# Patient Record
Sex: Female | Born: 1970 | Race: Black or African American | Hispanic: No | Marital: Single | Smoking: Current every day smoker
Health system: Southern US, Community
[De-identification: ages and names within clinical notes are randomized; demographics above are authoritative.]

## PROBLEM LIST (undated history)

## (undated) DIAGNOSIS — K219 Gastro-esophageal reflux disease without esophagitis: Secondary | ICD-10-CM

## (undated) HISTORY — PX: TUBAL LIGATION: SHX77

## (undated) HISTORY — DX: Gastro-esophageal reflux disease without esophagitis: K21.9

## (undated) HISTORY — PX: TONSILLECTOMY: SUR1361

---

## 1999-10-20 ENCOUNTER — Encounter: Payer: Self-pay | Admitting: Obstetrics

## 1999-10-20 ENCOUNTER — Inpatient Hospital Stay (HOSPITAL_COMMUNITY): Admission: AD | Admit: 1999-10-20 | Discharge: 1999-10-20 | Payer: Self-pay | Admitting: Obstetrics

## 2002-01-26 ENCOUNTER — Emergency Department (HOSPITAL_COMMUNITY): Admission: EM | Admit: 2002-01-26 | Discharge: 2002-01-26 | Payer: Self-pay | Admitting: Emergency Medicine

## 2002-01-26 ENCOUNTER — Encounter: Payer: Self-pay | Admitting: Emergency Medicine

## 2002-02-16 ENCOUNTER — Emergency Department (HOSPITAL_COMMUNITY): Admission: EM | Admit: 2002-02-16 | Discharge: 2002-02-16 | Payer: Self-pay | Admitting: Emergency Medicine

## 2003-10-04 ENCOUNTER — Emergency Department (HOSPITAL_COMMUNITY): Admission: EM | Admit: 2003-10-04 | Discharge: 2003-10-04 | Payer: Self-pay | Admitting: Emergency Medicine

## 2005-02-03 ENCOUNTER — Emergency Department (HOSPITAL_COMMUNITY): Admission: EM | Admit: 2005-02-03 | Discharge: 2005-02-03 | Payer: Self-pay | Admitting: Emergency Medicine

## 2005-05-08 ENCOUNTER — Emergency Department (HOSPITAL_COMMUNITY): Admission: EM | Admit: 2005-05-08 | Discharge: 2005-05-08 | Payer: Self-pay | Admitting: Emergency Medicine

## 2007-02-07 ENCOUNTER — Emergency Department (HOSPITAL_COMMUNITY): Admission: EM | Admit: 2007-02-07 | Discharge: 2007-02-07 | Payer: Self-pay | Admitting: Emergency Medicine

## 2007-03-29 ENCOUNTER — Emergency Department (HOSPITAL_COMMUNITY): Admission: EM | Admit: 2007-03-29 | Discharge: 2007-03-29 | Payer: Self-pay | Admitting: Emergency Medicine

## 2007-04-25 ENCOUNTER — Emergency Department (HOSPITAL_COMMUNITY): Admission: EM | Admit: 2007-04-25 | Discharge: 2007-04-25 | Payer: Self-pay | Admitting: General Surgery

## 2007-05-06 HISTORY — PX: ABDOMINAL HYSTERECTOMY: SHX81

## 2008-03-04 ENCOUNTER — Emergency Department (HOSPITAL_BASED_OUTPATIENT_CLINIC_OR_DEPARTMENT_OTHER): Admission: EM | Admit: 2008-03-04 | Discharge: 2008-03-04 | Payer: Self-pay | Admitting: Emergency Medicine

## 2008-10-24 ENCOUNTER — Emergency Department (HOSPITAL_BASED_OUTPATIENT_CLINIC_OR_DEPARTMENT_OTHER): Admission: EM | Admit: 2008-10-24 | Discharge: 2008-10-24 | Payer: Self-pay | Admitting: Emergency Medicine

## 2008-12-01 ENCOUNTER — Emergency Department (HOSPITAL_BASED_OUTPATIENT_CLINIC_OR_DEPARTMENT_OTHER): Admission: EM | Admit: 2008-12-01 | Discharge: 2008-12-01 | Payer: Self-pay | Admitting: Emergency Medicine

## 2010-05-17 ENCOUNTER — Emergency Department (HOSPITAL_BASED_OUTPATIENT_CLINIC_OR_DEPARTMENT_OTHER)
Admission: EM | Admit: 2010-05-17 | Discharge: 2010-05-17 | Payer: Self-pay | Source: Home / Self Care | Admitting: Emergency Medicine

## 2010-08-11 LAB — URINALYSIS, ROUTINE W REFLEX MICROSCOPIC
Bilirubin Urine: NEGATIVE
Glucose, UA: NEGATIVE mg/dL
Ketones, ur: NEGATIVE mg/dL
Nitrite: POSITIVE — AB
Protein, ur: 30 mg/dL — AB
Specific Gravity, Urine: 1.015 (ref 1.005–1.030)
Urobilinogen, UA: 1 mg/dL (ref 0.0–1.0)
pH: 6 (ref 5.0–8.0)

## 2010-08-11 LAB — URINE MICROSCOPIC-ADD ON

## 2010-08-11 LAB — URINE CULTURE: Colony Count: >=100000

## 2011-02-07 LAB — DIFFERENTIAL
Basophils Absolute: 0
Basophils Relative: 1
Eosinophils Relative: 1
Monocytes Absolute: 0.7

## 2011-02-07 LAB — BASIC METABOLIC PANEL
BUN: 2 — ABNORMAL LOW
Calcium: 9.2
Chloride: 107
Creatinine, Ser: 0.57
GFR calc Af Amer: >60
GFR calc non Af Amer: >60

## 2011-02-07 LAB — URINALYSIS, ROUTINE W REFLEX MICROSCOPIC
Bilirubin Urine: NEGATIVE
Nitrite: NEGATIVE
Specific Gravity, Urine: 1.014
Urobilinogen, UA: 1
pH: 7

## 2011-02-07 LAB — CBC
HCT: 35.5 — ABNORMAL LOW
Hemoglobin: 12.2
MCHC: 34.5
MCV: 92.8
RDW: 13

## 2011-02-07 LAB — URINE MICROSCOPIC-ADD ON

## 2011-02-07 LAB — PREGNANCY, URINE: Preg Test, Ur: NEGATIVE

## 2011-02-13 LAB — WET PREP, GENITAL
Trich, Wet Prep: NONE SEEN
Yeast Wet Prep HPF POC: NONE SEEN

## 2011-02-13 LAB — DIFFERENTIAL
Basophils Absolute: 0
Basophils Relative: 0
Monocytes Relative: 6
Neutro Abs: 5.7
Neutrophils Relative %: 66

## 2011-02-13 LAB — URINALYSIS, ROUTINE W REFLEX MICROSCOPIC
Bilirubin Urine: NEGATIVE
Hgb urine dipstick: NEGATIVE
Protein, ur: NEGATIVE
Urobilinogen, UA: 0.2

## 2011-02-13 LAB — CBC
MCHC: 34.3
Platelets: 158
RBC: 4.24
WBC: 8.8

## 2011-02-13 LAB — GC/CHLAMYDIA PROBE AMP, GENITAL: Chlamydia, DNA Probe: NEGATIVE

## 2011-03-09 ENCOUNTER — Emergency Department (HOSPITAL_COMMUNITY)
Admission: EM | Admit: 2011-03-09 | Discharge: 2011-03-10 | Discharge status: Against Medical Advice | Payer: Self-pay | Attending: Emergency Medicine | Admitting: Emergency Medicine

## 2011-03-09 ENCOUNTER — Encounter: Payer: Self-pay | Admitting: *Deleted

## 2011-03-09 DIAGNOSIS — R05 Cough: Secondary | ICD-10-CM | POA: Insufficient documentation

## 2011-03-09 DIAGNOSIS — R059 Cough, unspecified: Secondary | ICD-10-CM | POA: Insufficient documentation

## 2011-03-09 DIAGNOSIS — J40 Bronchitis, not specified as acute or chronic: Secondary | ICD-10-CM | POA: Insufficient documentation

## 2011-03-09 DIAGNOSIS — M549 Dorsalgia, unspecified: Secondary | ICD-10-CM | POA: Insufficient documentation

## 2011-03-09 NOTE — ED Notes (Signed)
Body aches all over cold cough pain inher chest when she coughs for the past 2-3 days with a temp

## 2011-03-10 ENCOUNTER — Emergency Department (HOSPITAL_COMMUNITY): Payer: Self-pay

## 2011-03-10 MED ORDER — HYDROCOD POLST-CHLORPHEN POLST 10-8 MG/5ML PO LQCR
5.0000 mL | Freq: Once | ORAL | Status: AC
  Administered 2011-03-10: 5 mL via ORAL
  Filled 2011-03-10: qty 5

## 2011-03-10 MED ORDER — ALBUTEROL SULFATE HFA 108 (90 BASE) MCG/ACT IN AERS
2.0000 | INHALATION_SPRAY | RESPIRATORY_TRACT | Status: DC
  Administered 2011-03-10: 2 via RESPIRATORY_TRACT
  Filled 2011-03-10: qty 6.7

## 2011-03-10 MED ORDER — HYDROCOD POLST-CHLORPHEN POLST 10-8 MG/5ML PO LQCR: 5.0000 mL | Freq: Two times a day (BID) | ORAL | Status: AC | PRN

## 2011-03-10 MED ORDER — ALBUTEROL SULFATE HFA 108 (90 BASE) MCG/ACT IN AERS: 1.0000 | INHALATION_SPRAY | RESPIRATORY_TRACT | Status: DC | PRN

## 2011-03-10 MED ORDER — ACETAMINOPHEN 325 MG PO TABS
650.0000 mg | ORAL_TABLET | Freq: Once | ORAL | Status: AC
  Administered 2011-03-10: 650 mg via ORAL
  Filled 2011-03-10: qty 2

## 2011-03-10 NOTE — ED Provider Notes (Signed)
History     CSN: [id number] Arrival date & time: 03/09/2011 11:11 PM   First MD Initiated Contact with Patient 03/10/11 0230      Chief Complaint  Patient presents with  . Back Pain     HPI 40 yo female presents to the ER with complaint of 3 days of cough, subjective fevers, and back/chest pain from coughing.  Pt is a smoker, but has not been able to smoke due to illness.  Daughter with similar illness.  No h/o COPD, but has had bronchitis in the past.     History reviewed. No pertinent past medical history.  Past Surgical History  Procedure Date  . Tonsillectomy     History reviewed. No pertinent family history.  History  Substance Use Topics  . Smoking status: Current Everyday Smoker  . Smokeless tobacco: Not on file  . Alcohol Use: Yes    OB History    Grav Para Term Preterm Abortions TAB SAB Ect Mult Living                  Review of Systems  Constitutional: Positive for fever.  Respiratory: Positive for cough, chest tightness, shortness of breath and wheezing.   All other systems reviewed and are negative.    Allergies  Review of patient's allergies indicates no known allergies.  Home Medications   Current Outpatient Rx  Name Route Sig Dispense Refill  . OVER THE COUNTER MEDICATION Oral Take 1 packet by mouth every 6 (six) hours as needed. Alka Seltzer cold and flu as needed for pain and cold symptoms     . ALBUTEROL SULFATE HFA 108 (90 BASE) MCG/ACT IN AERS Inhalation Inhale 1-2 puffs into the lungs every 4 (four) hours as needed for wheezing. 1 Inhaler 0  . HYDROCOD POLST-CHLORPHEN POLST 10-8 MG/5ML PO LQCR Oral Take 5 mLs by mouth every 12 (twelve) hours as needed. 140 mL 0    BP 101/60  Pulse 84  Temp(Src) 100.2 F (37.9 C) (Oral)  Resp 18  SpO2 96%  Physical Exam  Nursing note and vitals reviewed. Constitutional: She is oriented to person, place, and time. She appears well-developed and well-nourished.       Uncomfortable appearing  HENT:   Head: Normocephalic and atraumatic.  Right Ear: External ear normal.  Left Ear: External ear normal.  Nose: Nose normal.  Mouth/Throat: Oropharynx is clear and moist.  Eyes: Conjunctivae and EOM are normal. Pupils are equal, round, and reactive to light.  Neck: Normal range of motion. Neck supple. No JVD present. No tracheal deviation present. No thyromegaly present.  Cardiovascular: Normal rate, regular rhythm, normal heart sounds and intact distal pulses.  Exam reveals no gallop and no friction rub.   No murmur heard. Pulmonary/Chest: Effort normal. No stridor. No respiratory distress. She has wheezes. She has no rales. She exhibits tenderness.       cough  Abdominal: Soft. Bowel sounds are normal. She exhibits no distension and no mass. There is no tenderness. There is no rebound and no guarding.  Musculoskeletal: Normal range of motion. She exhibits no edema and no tenderness.  Lymphadenopathy:    She has no cervical adenopathy.  Neurological: She is oriented to person, place, and time.  Skin: Skin is dry. No rash noted. No erythema. No pallor.  Psychiatric: She has a normal mood and affect. Her behavior is normal. Judgment and thought content normal.    ED Course  Procedures (including critical care time)  Labs Reviewed -  No data to display Dg Chest 2 View  03/10/2011  *RADIOLOGY REPORT*  Clinical Data: Cough, fever, chest pain.  CHEST - 2 VIEW  Comparison: None.  Findings: Mild interstitial prominence without focal consolidation. No pleural effusion or pneumothorax.  Cardiomediastinal contours are within normal limits.  No acute osseous abnormality.  there may be a small calcified granuloma projecting over the left upper lung.  IMPRESSION: Mild interstitial prominence without focal consolidation.  Original Report Authenticated By: Waneta Martins, M.D.     1. Bronchitis       MDM  Pt feeling better after albuterol/tussionex.  Pt cautioned against smoking.  Will prescribe  same for home use.         Olivia Mackie, MD 03/10/11 [phone]

## 2012-09-07 ENCOUNTER — Ambulatory Visit (INDEPENDENT_AMBULATORY_CARE_PROVIDER_SITE_OTHER): Payer: BLUE CROSS/BLUE SHIELD | Admitting: Family Medicine

## 2012-09-07 VITALS — BP 110/72 | HR 92 | Temp 98.4°F | Resp 16 | Ht 63.0 in | Wt 147.0 lb

## 2012-09-07 DIAGNOSIS — N949 Unspecified condition associated with female genital organs and menstrual cycle: Secondary | ICD-10-CM

## 2012-09-07 DIAGNOSIS — IMO0002 Reserved for concepts with insufficient information to code with codable children: Secondary | ICD-10-CM

## 2012-09-07 DIAGNOSIS — Z0000 Encounter for general adult medical examination without abnormal findings: Secondary | ICD-10-CM

## 2012-09-07 DIAGNOSIS — Z803 Family history of malignant neoplasm of breast: Secondary | ICD-10-CM

## 2012-09-07 DIAGNOSIS — R102 Pelvic and perineal pain: Secondary | ICD-10-CM

## 2012-09-07 DIAGNOSIS — K219 Gastro-esophageal reflux disease without esophagitis: Secondary | ICD-10-CM

## 2012-09-07 DIAGNOSIS — N941 Unspecified dyspareunia: Secondary | ICD-10-CM

## 2012-09-07 LAB — POCT CBC
HCT, POC: 42.6 % (ref 37.7–47.9)
Lymph, poc: 3.3 (ref 0.6–3.4)
MCH, POC: 29.9 pg (ref 27–31.2)
MCHC: 31 g/dL — AB (ref 31.8–35.4)
MCV: 96.5 fL (ref 80–97)
POC Granulocyte: 6.8 (ref 2–6.9)
POC LYMPH PERCENT: 30.7 %L (ref 10–50)
RDW, POC: 13.2 %
WBC: 10.8 10*3/uL — AB (ref 4.6–10.2)

## 2012-09-07 LAB — COMPREHENSIVE METABOLIC PANEL
ALT: 17 U/L (ref 0–35)
AST: 18 U/L (ref 0–37)
BUN: 7 mg/dL (ref 6–23)
CO2: 28 mEq/L (ref 19–32)
Calcium: 9.7 mg/dL (ref 8.4–10.5)
Chloride: 103 mEq/L (ref 96–112)
Creat: 0.55 mg/dL (ref 0.50–1.10)
Total Bilirubin: 0.7 mg/dL (ref 0.3–1.2)

## 2012-09-07 LAB — POCT URINALYSIS DIPSTICK
Bilirubin, UA: NEGATIVE
Protein, UA: NEGATIVE
Spec Grav, UA: 1.025
pH, UA: 5

## 2012-09-07 LAB — POCT UA - MICROSCOPIC ONLY
Bacteria, U Microscopic: NEGATIVE
Casts, Ur, LPF, POC: NEGATIVE
Mucus, UA: NEGATIVE
Yeast, UA: NEGATIVE

## 2012-09-07 LAB — POCT WET PREP WITH KOH: KOH Prep POC: NEGATIVE

## 2012-09-07 LAB — TSH: TSH: 2.872 u[IU]/mL (ref 0.350–4.500)

## 2012-09-07 MED ORDER — OMEPRAZOLE 40 MG PO CPDR: 40.0000 mg | DELAYED_RELEASE_CAPSULE | Freq: Every day | ORAL | Status: AC

## 2012-09-07 NOTE — Progress Notes (Signed)
  Subjective:    Patient ID: Tammy Berry, female    DOB: 01-26-1971, 42 y.o.   MRN: [id number]  HPI    Review of Systems  Constitutional: Negative.   HENT: Negative.   Eyes: Negative.   Respiratory: Negative.   Cardiovascular: Positive for chest pain.  Gastrointestinal: Negative.   Genitourinary: Positive for enuresis and vaginal pain.  Musculoskeletal: Positive for arthralgias.  Skin: Negative.   Allergic/Immunologic: Negative.   Neurological: Negative.   Hematological: Negative.   Psychiatric/Behavioral: Negative.        Objective:   Physical Exam        Assessment & Plan:

## 2012-09-07 NOTE — Patient Instructions (Signed)
Get pelvic ultrasound  [facility] and [facility]  Omeprazole one daily for reflux  Return if not improving

## 2012-09-07 NOTE — Progress Notes (Signed)
Physical exam:  History: 42 year old lady who is here for physical examination. She says she is way overdue one. She has a history of pelvic pain and dyspareunia. She would like that is assessed. No other major acute complaints.  Past medical history: Medical illnesses: None Surgical history: T&A Hysterectomy Bilateral tubal ligation Medications: None Allergies: None   Family history: Parents are living. Mother has had breast cancer and hypertension. Father is 93 years old and in good health. Mother 72. Siblings are healthy.  Social history: Patient is not married. One regular sexual partner for the past 5 years. She smokes one third pack cigarettes a day. Drinks 1 glass of wine a month socially. Does not use illicit drugs. She works as a Scientist, research (medical). She does not exercise.  Review of systems: See below for my assistance note  Physical examination: Well-developed well-nourished Afro-American female in no acute distress. TMs normal. Eyes PERRLA. Throat clear. Neck supple without nodes thyromegaly. No carotid bruits. Chest is clear to auscultation. Heart regular without murmurs gallops or arrhythmias. Abdomen soft without mass or tenderness. Has a tattoo on left breast. Breasts full without any masses. No axillary nodes. She has an umbilical ring. Pelvic normal external genitalia. Vaginal mucosa dry, some whitish discharge. Cervix is absent. Bimanual exam feels tenderness of the right ovary. However it does not feel particularly enlarged. Left is normal. Extremities unremarkable. Has joint pains in her hands, but that is probably from using them.:   Assessment: Physical examination, annual Arthralgias Right pelvic pain Dyspareunia Family history of breast cancer  Plan: Check labs including URiprobe, CBC, C. met, TSH, lipid. Schedule mammogram. Schedule pelvic ultrasound.

## 2012-09-08 ENCOUNTER — Other Ambulatory Visit: Payer: Self-pay | Admitting: Radiology

## 2012-09-08 DIAGNOSIS — R102 Pelvic and perineal pain: Secondary | ICD-10-CM

## 2012-09-08 LAB — GC/CHLAMYDIA PROBE AMP, URINE: GC Probe Amp, Urine: NEGATIVE

## 2012-09-09 LAB — HELICOBACTER PYLORI  ANTIBODY, IGM: Helicobacter pylori, IgM: 8.1 U/mL (ref <9.0)

## 2012-09-13 ENCOUNTER — Ambulatory Visit
Admission: RE | Admit: 2012-09-13 | Discharge: 2012-09-13 | Disposition: A | Discharge status: Home / Self Care | Payer: BC Managed Care – PPO | Source: Ambulatory Visit | Attending: Family Medicine | Admitting: Family Medicine

## 2012-09-13 DIAGNOSIS — R102 Pelvic and perineal pain: Secondary | ICD-10-CM

## 2012-09-13 DIAGNOSIS — N941 Unspecified dyspareunia: Secondary | ICD-10-CM

## 2012-11-08 ENCOUNTER — Other Ambulatory Visit: Payer: Self-pay

## 2013-04-07 ENCOUNTER — Ambulatory Visit (INDEPENDENT_AMBULATORY_CARE_PROVIDER_SITE_OTHER): Payer: BLUE CROSS/BLUE SHIELD | Admitting: Emergency Medicine

## 2013-04-07 VITALS — BP 110/68 | HR 84 | Temp 98.2°F | Resp 16 | Ht 64.0 in | Wt 158.2 lb

## 2013-04-07 DIAGNOSIS — R059 Cough, unspecified: Secondary | ICD-10-CM

## 2013-04-07 DIAGNOSIS — R05 Cough: Secondary | ICD-10-CM

## 2013-04-07 DIAGNOSIS — J209 Acute bronchitis, unspecified: Secondary | ICD-10-CM

## 2013-04-07 LAB — POCT INFLUENZA A/B
Influenza A, POC: NEGATIVE
Influenza B, POC: NEGATIVE

## 2013-04-07 MED ORDER — AZITHROMYCIN 250 MG PO TABS: ORAL_TABLET | ORAL | Status: DC

## 2013-04-07 MED ORDER — HYDROCOD POLST-CHLORPHEN POLST 10-8 MG/5ML PO LQCR: 5.0000 mL | Freq: Two times a day (BID) | ORAL | Status: AC | PRN

## 2013-04-07 NOTE — Progress Notes (Signed)
[facility] [address], [place] [address] [phone]  Date:  04/07/2013   Name:  Tammy Berry   DOB:  09-05-1970   MRN:  [id number]  PCP:  No PCP Per Patient    Chief Complaint: Cough, Sore Throat, Generalized Body Aches and Headaches   History of Present Illness:  Tammy Berry is a 42 y.o. very pleasant female patient who presents with the following:  Ill since yesterday with sore throat and myalgias and arthralgias.  Has a non-productive cough.  Has clear nasal drainage and a post nasal drainage.  Says developed a headache and fatigue.  Says light troubles her eyes.  No history of migraines.  Nauseated and vomited once today.  No flu shot.  No improvement with over the counter medications or other home remedies. Denies other complaint or health concern today.   There are no active problems to display for this patient.   Past Medical History  Diagnosis Date  . GERD (gastroesophageal reflux disease)     Past Surgical History  Procedure Laterality Date  . Tonsillectomy    . Abdominal hysterectomy  2009  . Tubal ligation    . Tonsillectomy Bilateral     History  Substance Use Topics  . Smoking status: Current Every Day Smoker  . Smokeless tobacco: Not on file  . Alcohol Use: Yes    Family History  Problem Relation Age of Onset  . Hypertension Mother   . Breast cancer Mother     No Known Allergies  Medication list has been reviewed and updated.  Current Outpatient Prescriptions on File Prior to Visit  Medication Sig Dispense Refill  . albuterol (PROVENTIL HFA;VENTOLIN HFA) 108 (90 BASE) MCG/ACT inhaler Inhale 1-2 puffs into the lungs every 4 (four) hours as needed for wheezing.  1 Inhaler  0  . chlorpheniramine-HYDROcodone (TUSSIONEX PENNKINETIC ER) 10-8 MG/5ML LQCR Take 5 mLs by mouth every 12 (twelve) hours as needed.  140 mL  0  . omeprazole (PRILOSEC) 40 MG capsule Take 1 capsule (40 mg total) by mouth daily.  30 capsule  3    No current facility-administered medications on file prior to visit.    Review of Systems:  As per HPI, otherwise negative.    Physical Examination: Filed Vitals:   04/07/13 1542  BP: 110/68  Pulse: 84  Temp: 98.2 F (36.8 C)  Resp: 16   Filed Vitals:   04/07/13 1542  Height: 5\' 4"  (1.626 m)  Weight: 158 lb 3.2 oz (71.759 kg)   Body mass index is 27.14 kg/(m^2). Ideal Body Weight: Weight in (lb) to have BMI = 25: 145.3  GEN: WDWN, moderate distress, Non-toxic, A & O x 3 HEENT: Atraumatic, Normocephalic. Neck supple. No masses, No LAD. Ears and Nose: No external deformity. CV: RRR, No M/G/R. No JVD. No thrill. No extra heart sounds. PULM: CTA B, no wheezes, crackles, rhonchi. No retractions. No resp. distress. No accessory muscle use. ABD: S, NT, ND, +BS. No rebound. No HSM. EXTR: No c/c/e NEURO Normal gait.  PSYCH: Normally interactive. Conversant. Not depressed or anxious appearing.  Calm demeanor.    Assessment and Plan: Bronchitis tussionex  zithromax   Signed,  Phillips Odor, MD   Results for orders placed in visit on 04/07/13  POCT INFLUENZA A/B      Result Value Range   Influenza A, POC Negative     Influenza B, POC Negative

## 2013-04-07 NOTE — Patient Instructions (Signed)

## 2013-11-14 ENCOUNTER — Encounter (HOSPITAL_BASED_OUTPATIENT_CLINIC_OR_DEPARTMENT_OTHER): Payer: Self-pay | Admitting: Emergency Medicine

## 2013-11-14 ENCOUNTER — Emergency Department (HOSPITAL_BASED_OUTPATIENT_CLINIC_OR_DEPARTMENT_OTHER)
Admission: EM | Admit: 2013-11-14 | Discharge: 2013-11-14 | Disposition: A | Discharge status: Home / Self Care | Payer: BC Managed Care – PPO | Attending: Emergency Medicine | Admitting: Emergency Medicine

## 2013-11-14 DIAGNOSIS — Y929 Unspecified place or not applicable: Secondary | ICD-10-CM | POA: Insufficient documentation

## 2013-11-14 DIAGNOSIS — F17200 Nicotine dependence, unspecified, uncomplicated: Secondary | ICD-10-CM | POA: Insufficient documentation

## 2013-11-14 DIAGNOSIS — S46911A Strain of unspecified muscle, fascia and tendon at shoulder and upper arm level, right arm, initial encounter: Secondary | ICD-10-CM

## 2013-11-14 DIAGNOSIS — Z79899 Other long term (current) drug therapy: Secondary | ICD-10-CM | POA: Insufficient documentation

## 2013-11-14 DIAGNOSIS — Y9389 Activity, other specified: Secondary | ICD-10-CM | POA: Insufficient documentation

## 2013-11-14 DIAGNOSIS — K219 Gastro-esophageal reflux disease without esophagitis: Secondary | ICD-10-CM | POA: Insufficient documentation

## 2013-11-14 DIAGNOSIS — Z792 Long term (current) use of antibiotics: Secondary | ICD-10-CM | POA: Insufficient documentation

## 2013-11-14 DIAGNOSIS — X58XXXA Exposure to other specified factors, initial encounter: Secondary | ICD-10-CM | POA: Insufficient documentation

## 2013-11-14 DIAGNOSIS — IMO0002 Reserved for concepts with insufficient information to code with codable children: Secondary | ICD-10-CM | POA: Insufficient documentation

## 2013-11-14 MED ORDER — OXYCODONE-ACETAMINOPHEN 5-325 MG PO TABS: 1.0000 | ORAL_TABLET | ORAL | Status: AC | PRN

## 2013-11-14 MED ORDER — PREDNISONE 10 MG PO TABS: 20.0000 mg | ORAL_TABLET | Freq: Every day | ORAL | Status: AC

## 2013-11-14 NOTE — ED Provider Notes (Signed)
CSN: [id number]     Arrival date & time 11/14/13  0705 History   First MD Initiated Contact with Patient 11/14/13 0746     Chief Complaint  Patient presents with  . Shoulder Pain     (Consider location/radiation/quality/duration/timing/severity/associated sxs/prior Treatment) Patient is a 43 y.o. female presenting with shoulder pain. The history is provided by the patient.  Shoulder Pain   patient here complaining of right shoulder pain x2 weeks. Pain characterized as sharp and worse with movement and localized to her right lateral deltoid and extends down to the midportion of her humerus. Denies any history of trauma. Pain is worse with certain positions. 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 No associated anginal type symptoms. She has been using muscle accidents at home without relief.  Past Medical History  Diagnosis Date  . GERD (gastroesophageal reflux disease)    Past Surgical History  Procedure Laterality Date  . Tonsillectomy    . Abdominal hysterectomy  2009  . Tubal ligation    . Tonsillectomy Bilateral    Family History  Problem Relation Age of Onset  . Hypertension Mother   . Breast cancer Mother    History  Substance Use Topics  . Smoking status: Current Every Day Smoker -- 0.50 packs/day    Types: Cigarettes  . Smokeless tobacco: Not on file  . Alcohol Use: Yes   OB History   Grav Para Term Preterm Abortions TAB SAB Ect Mult Living                 Review of Systems  All other systems reviewed and are negative.     Allergies  Review of patient's allergies indicates no known allergies.  Home Medications   Prior to Admission medications   Medication Sig Start Date End Date Taking? Authorizing Provider  albuterol (PROVENTIL HFA;VENTOLIN HFA) 108 (90 BASE) MCG/ACT inhaler Inhale 1-2 puffs into the lungs every 4 (four) hours as needed for wheezing. 03/10/11 03/09/12  Olivia Mackie, MD  azithromycin (ZITHROMAX) 250 MG tablet Take 2 tabs PO x  1 dose, then 1 tab PO QD x 4 days 04/07/13   Phillips Odor, MD  chlorpheniramine-HYDROcodone [facility] PENNKINETIC ER) 10-8 MG/5ML LQCR Take 5 mLs by mouth every 12 (twelve) hours as needed. 03/10/11   Olivia Mackie, MD  chlorpheniramine-HYDROcodone (TUSSIONEX PENNKINETIC ER) 10-8 MG/5ML LQCR Take 5 mLs by mouth every 12 (twelve) hours as needed. 04/07/13   Phillips Odor, MD  omeprazole (PRILOSEC) 40 MG capsule Take 1 capsule (40 mg total) by mouth daily. 09/07/12   Peyton Najjar, MD  oxyCODONE-acetaminophen (PERCOCET/ROXICET) 5-325 MG per tablet Take 1-2 tablets by mouth every 4 (four) hours as needed for severe pain. 11/14/13   Toy Baker, MD  predniSONE (DELTASONE) 10 MG tablet Take 2 tablets (20 mg total) by mouth daily. 11/14/13   Toy Baker, MD   BP 118/77  Pulse 75  Temp(Src) 98.1 F (36.7 C) (Oral)  SpO2 100% Physical Exam  Nursing note and vitals reviewed. Constitutional: She is oriented to person, place, and time. She appears well-developed and well-nourished.  Non-toxic appearance. No distress.  HENT:  Head: Normocephalic and atraumatic.  Eyes: Conjunctivae, EOM and lids are normal. Pupils are equal, round, and reactive to light.  Neck: Normal range of motion. Neck supple. No tracheal deviation present. No mass present.  Cardiovascular: Normal rate, regular rhythm and normal heart sounds.  Exam reveals no gallop.   No murmur heard. Pulmonary/Chest: Effort normal  and breath sounds normal. No stridor. No respiratory distress. She has no decreased breath sounds. She has no wheezes. She has no rhonchi. She has no rales.  Abdominal: Soft. Normal appearance and bowel sounds are normal. She exhibits no distension. There is no tenderness. There is no rebound and no CVA tenderness.  Musculoskeletal: Normal range of motion. She exhibits no edema and no tenderness.       Arms: Neurological: She is alert and oriented to person, place, and time. She has normal strength. No cranial  nerve deficit or sensory deficit. GCS eye subscore is 4. GCS verbal subscore is 5. GCS motor subscore is 6.  Skin: Skin is warm and dry. No abrasion and no rash noted.  Psychiatric: She has a normal mood and affect. Her speech is normal and behavior is normal.    ED Course  Procedures (including critical care time) Labs Review Labs Reviewed - No data to display  Imaging Review No results found.   EKG Interpretation None      MDM   Final diagnoses:  Shoulder strain, right, initial encounter    Patient with likely bursitis we'll be treated with prednisone and given referral to sports medicine    Toy BakerAnthony T Emanuel Dowson, MD 11/14/13 [phone]

## 2013-11-14 NOTE — ED Notes (Signed)
C/o right shoulder pain and has trouble using and lifting right arm. States she has been under a lot of stress. Pain started in neck and went to right shoulder. Neck is not hurting now.

## 2013-11-14 NOTE — Discharge Instructions (Signed)
Cryotherapy Cryotherapy means treatment with cold. Ice or gel packs can be used to reduce both pain and swelling. Ice is the most helpful within the first 24 to 48 hours after an injury or flareup from overusing a muscle or joint. Sprains, strains, spasms, burning pain, shooting pain, and aches can all be eased with ice. Ice can also be used when recovering from surgery. Ice is effective, has very few side effects, and is safe for most people to use. PRECAUTIONS  Ice is not a safe treatment option for people with:  Raynaud's phenomenon. This is a condition affecting small blood vessels in the extremities. Exposure to cold may cause your problems to return.  Cold hypersensitivity. There are many forms of cold hypersensitivity, including:  Cold urticaria. Red, itchy hives appear on the skin when the tissues begin to warm after being iced.  Cold erythema. This is a red, itchy rash caused by exposure to cold.  Cold hemoglobinuria. Red blood cells break down when the tissues begin to warm after being iced. The hemoglobin that carry oxygen are passed into the urine because they cannot combine with blood proteins fast enough.  Numbness or altered sensitivity in the area being iced. If you have any of the following conditions, do not use ice until you have discussed cryotherapy with your caregiver:  Heart conditions, such as arrhythmia, angina, or chronic heart disease.  High blood pressure.  Healing wounds or open skin in the area being iced.  Current infections.  Rheumatoid arthritis.  Poor circulation.  Diabetes. Ice slows the blood flow in the region it is applied. This is beneficial when trying to stop inflamed tissues from spreading irritating chemicals to surrounding tissues. However, if you expose your skin to cold temperatures for too long or without the proper protection, you can damage your skin or nerves. Watch for signs of skin damage due to cold. HOME CARE INSTRUCTIONS Follow  these tips to use ice and cold packs safely.  Place a dry or damp towel between the ice and skin. A damp towel will cool the skin more quickly, so you may need to shorten the time that the ice is used.  For a more rapid response, add gentle compression to the ice.  Ice for no more than 10 to 20 minutes at a time. The bonier the area you are icing, the less time it will take to get the benefits of ice.  Check your skin after 5 minutes to make sure there are no signs of a poor response to cold or skin damage.  Rest 20 minutes or more in between uses.  Once your skin is numb, you can end your treatment. You can test numbness by very lightly touching your skin. The touch should be so light that you do not see the skin dimple from the pressure of your fingertip. When using ice, most people will feel these normal sensations in this order: cold, burning, aching, and numbness.  Do not use ice on someone who cannot communicate their responses to pain, such as small children or people with dementia. HOW TO MAKE AN ICE PACK Ice packs are the most common way to use ice therapy. Other methods include ice massage, ice baths, and cryo-sprays. Muscle creams that cause a cold, tingly feeling do not offer the same benefits that ice offers and should not be used as a substitute unless recommended by your caregiver. To make an ice pack, do one of the following:  Place crushed ice or  a bag of frozen vegetables in a sealable plastic bag. Squeeze out the excess air. Place this bag inside another plastic bag. Slide the bag into a pillowcase or place a damp towel between your skin and the bag.  Mix 3 parts water with 1 part rubbing alcohol. Freeze the mixture in a sealable plastic bag. When you remove the mixture from the freezer, it will be slushy. Squeeze out the excess air. Place this bag inside another plastic bag. Slide the bag into a pillowcase or place a damp towel between your skin and the bag. SEEK MEDICAL  CARE IF:  You develop white spots on your skin. This may give the skin a blotchy (mottled) appearance.  Your skin turns blue or pale.  Your skin becomes waxy or hard.  Your swelling gets worse. MAKE SURE YOU:   Understand these instructions.  Will watch your condition.  Will get help right away if you are not doing well or get worse. Document Released: 12/16/2010 Document Revised: 07/14/2011 Document Reviewed: 12/16/2010 [facility] Patient Information 2015 [place], [place]. This information is not intended to replace advice given to you by your health care provider. Make sure you discuss any questions you have with your health care provider. Shoulder Pain The shoulder is the joint that connects your arms to your body. The bones that form the shoulder joint include the upper arm bone (humerus), the shoulder blade (scapula), and the collarbone (clavicle). The top of the humerus is shaped like a ball and fits into a rather flat socket on the scapula (glenoid cavity). A combination of muscles and strong, fibrous tissues that connect muscles to bones (tendons) support your shoulder joint and hold the ball in the socket. Small, fluid-filled sacs (bursae) are located in different areas of the joint. They act as cushions between the bones and the overlying soft tissues and help reduce friction between the gliding tendons and the bone as you move your arm. Your shoulder joint allows a wide range of motion in your arm. This range of motion allows you to do things like scratch your back or throw a ball. However, this range of motion also makes your shoulder more prone to pain from overuse and injury. Causes of shoulder pain can originate from both injury and overuse and usually can be grouped in the following four categories:  Redness, swelling, and pain (inflammation) of the tendon (tendinitis) or the bursae (bursitis).  Instability, such as a dislocation of the joint.  Inflammation of the joint  (arthritis).  Broken bone (fracture). HOME CARE INSTRUCTIONS   Apply ice to the sore area.  Put ice in a plastic bag.  Place a towel between your skin and the bag.  Leave the ice on for 15-20 minutes, 3-4 times per day for the first 2 days, or as directed by your health care provider.  Stop using cold packs if they do not help with the pain.  If you have a shoulder sling or immobilizer, wear it as long as your caregiver instructs. Only remove it to shower or bathe. Move your arm as little as possible, but keep your hand moving to prevent swelling.  Squeeze a soft ball or foam pad as much as possible to help prevent swelling.  Only take over-the-counter or prescription medicines for pain, discomfort, or fever as directed by your caregiver. SEEK MEDICAL CARE IF:   Your shoulder pain increases, or new pain develops in your arm, hand, or fingers.  Your hand or fingers become cold and  numb.  Your pain is not relieved with medicines. SEEK IMMEDIATE MEDICAL CARE IF:   Your arm, hand, or fingers are numb or tingling.  Your arm, hand, or fingers are significantly swollen or turn white or blue. MAKE SURE YOU:   Understand these instructions.  Will watch your condition.  Will get help right away if you are not doing well or get worse. Document Released: 01/29/2005 Document Revised: 04/26/2013 Document Reviewed: 04/05/2011 [facility] Patient Information 2015 [place]. This information is not intended to replace advice given to you by your health care provider. Make sure you discuss any questions you have with your health care provider.

## 2014-04-06 ENCOUNTER — Encounter (HOSPITAL_BASED_OUTPATIENT_CLINIC_OR_DEPARTMENT_OTHER): Payer: Self-pay | Admitting: Emergency Medicine

## 2014-04-06 ENCOUNTER — Emergency Department (HOSPITAL_BASED_OUTPATIENT_CLINIC_OR_DEPARTMENT_OTHER)
Admission: EM | Admit: 2014-04-06 | Discharge: 2014-04-06 | Disposition: A | Discharge status: Home / Self Care | Payer: BC Managed Care – PPO | Attending: Emergency Medicine | Admitting: Emergency Medicine

## 2014-04-06 ENCOUNTER — Emergency Department (HOSPITAL_BASED_OUTPATIENT_CLINIC_OR_DEPARTMENT_OTHER): Payer: BC Managed Care – PPO

## 2014-04-06 DIAGNOSIS — R05 Cough: Secondary | ICD-10-CM

## 2014-04-06 DIAGNOSIS — Z79891 Long term (current) use of opiate analgesic: Secondary | ICD-10-CM | POA: Insufficient documentation

## 2014-04-06 DIAGNOSIS — Z720 Tobacco use: Secondary | ICD-10-CM | POA: Insufficient documentation

## 2014-04-06 DIAGNOSIS — R059 Cough, unspecified: Secondary | ICD-10-CM

## 2014-04-06 DIAGNOSIS — J069 Acute upper respiratory infection, unspecified: Secondary | ICD-10-CM | POA: Insufficient documentation

## 2014-04-06 DIAGNOSIS — K219 Gastro-esophageal reflux disease without esophagitis: Secondary | ICD-10-CM | POA: Insufficient documentation

## 2014-04-06 DIAGNOSIS — Z7952 Long term (current) use of systemic steroids: Secondary | ICD-10-CM | POA: Insufficient documentation

## 2014-04-06 MED ORDER — IPRATROPIUM BROMIDE 0.02 % IN SOLN
0.5000 mg | Freq: Once | RESPIRATORY_TRACT | Status: AC
  Administered 2014-04-06: 0.5 mg via RESPIRATORY_TRACT
  Filled 2014-04-06: qty 2.5

## 2014-04-06 MED ORDER — ALBUTEROL SULFATE HFA 108 (90 BASE) MCG/ACT IN AERS: 1.0000 | INHALATION_SPRAY | RESPIRATORY_TRACT | Status: AC | PRN

## 2014-04-06 MED ORDER — ALBUTEROL SULFATE (2.5 MG/3ML) 0.083% IN NEBU
5.0000 mg | INHALATION_SOLUTION | Freq: Once | RESPIRATORY_TRACT | Status: AC
  Administered 2014-04-06: 5 mg via RESPIRATORY_TRACT
  Filled 2014-04-06: qty 6

## 2014-04-06 MED ORDER — GUAIFENESIN-CODEINE 100-10 MG/5ML PO SOLN: 5.0000 mL | Freq: Three times a day (TID) | ORAL | Status: AC | PRN

## 2014-04-06 MED ORDER — AZITHROMYCIN 250 MG PO TABS: ORAL_TABLET | ORAL | Status: AC

## 2014-04-06 NOTE — ED Notes (Addendum)
Pt c/o cough and body aches for over a week. Reports taking Mucinex and Tylenol severe sinus w/ no relief. Pt does not have a temp at this time and denies previously having one.

## 2014-04-06 NOTE — Discharge Instructions (Signed)
Cough, Adult  A cough is a reflex that helps clear your throat and airways. It can help heal the body or may be a reaction to an irritated airway. A cough may only last 2 or 3 weeks (acute) or may last more than 8 weeks (chronic).  CAUSES Acute cough:  Viral or bacterial infections. Chronic cough:  Infections.  Allergies.  Asthma.  Post-nasal drip.  Smoking.  Heartburn or acid reflux.  Some medicines.  Chronic lung problems (COPD).  Cancer. SYMPTOMS   Cough.  Fever.  Chest pain.  Increased breathing rate.  High-pitched whistling sound when breathing (wheezing).  Colored mucus that you cough up (sputum). TREATMENT   A bacterial cough may be treated with antibiotic medicine.  A viral cough must run its course and will not respond to antibiotics.  Your caregiver may recommend other treatments if you have a chronic cough. HOME CARE INSTRUCTIONS   Only take over-the-counter or prescription medicines for pain, discomfort, or fever as directed by your caregiver. Use cough suppressants only as directed by your caregiver.  Use a cold steam vaporizer or humidifier in your bedroom or home to help loosen secretions.  Sleep in a semi-upright position if your cough is worse at night.  Rest as needed.  Stop smoking if you smoke. SEEK IMMEDIATE MEDICAL CARE IF:   You have pus in your sputum.  Your cough starts to worsen.  You cannot control your cough with suppressants and are losing sleep.  You begin coughing up blood.  You have difficulty breathing.  You develop pain which is getting worse or is uncontrolled with medicine.  You have a fever. MAKE SURE YOU:   Understand these instructions.  Will watch your condition.  Will get help right away if you are not doing well or get worse. Document Released: 10/18/2010 Document Revised: 07/14/2011 Document Reviewed: 10/18/2010 Exit[employer] Patient Information 2015 [employer]. This information is not intended  to replace advice given to you by your health care provider. Make sure you discuss any questions you have with your health care provider.  

## 2014-04-06 NOTE — ED Provider Notes (Signed)
CSN: [id number]     Arrival date & time 04/06/14  1635 History   First MD Initiated Contact with Patient 04/06/14 1648     Chief Complaint  Patient presents with  . Cough  . Generalized Body Aches     (Consider location/radiation/quality/duration/timing/severity/associated sxs/prior Treatment) HPI Comments:  Patient with past medical history of bronchitis, presents emergency department with chief complaint of cough. She states that the cough has been progressively worsening. She states that is now productive. She denies any fevers or chills. She states that the coughing makes her throat sore. She denies any nausea or vomiting. She has tried taking OTC cough and cold medications with no relief. She states that her throat is soothed  When she drinks warm drinks.   There are no other aggravating or alleviating factors.  The history is provided by the patient. No language interpreter was used.    Past Medical History  Diagnosis Date  . GERD (gastroesophageal reflux disease)    Past Surgical History  Procedure Laterality Date  . Tonsillectomy    . Abdominal hysterectomy  2009  . Tubal ligation    . Tonsillectomy Bilateral    Family History  Problem Relation Age of Onset  . Hypertension Mother   . Breast cancer Mother    History  Substance Use Topics  . Smoking status: Current Every Day Smoker -- 0.50 packs/day    Types: Cigarettes  . Smokeless tobacco: Not on file  . Alcohol Use: Yes   OB History    No data available     Review of Systems  Constitutional: Positive for chills. Negative for fever.  HENT: Positive for postnasal drip, rhinorrhea, sinus pressure, sneezing and sore throat.   Respiratory: Positive for cough and wheezing. Negative for shortness of breath.   Cardiovascular: Negative for chest pain.  Gastrointestinal: Negative for nausea, vomiting, abdominal pain, diarrhea and constipation.  Genitourinary: Negative for dysuria.  All other systems reviewed and are  negative.     Allergies  Hydrocodone  Home Medications   Prior to Admission medications   Medication Sig Start Date End Date Taking? Authorizing Provider  omeprazole (PRILOSEC) 40 MG capsule Take 1 capsule (40 mg total) by mouth daily. 09/07/12  Yes Peyton Najjaravid H Hopper, MD  albuterol (PROVENTIL HFA;VENTOLIN HFA) 108 (90 BASE) MCG/ACT inhaler Inhale 1-2 puffs into the lungs every 4 (four) hours as needed for wheezing. 04/06/14 04/06/15  Roxy Horsemanobert Michaeleen Down, PA-C  azithromycin (ZITHROMAX) 250 MG tablet Take 2 tabs PO x 1 dose, then 1 tab PO QD x 4 days 04/06/14   Roxy Horsemanobert Prajna Vanderpool, PA-C  chlorpheniramine-HYDROcodone [facility](TUSSIONEX PENNKINETIC ER) 10-8 MG/5ML LQCR Take 5 mLs by mouth every 12 (twelve) hours as needed. 03/10/11   Olivia Mackielga M Otter, MD  chlorpheniramine-HYDROcodone (TUSSIONEX PENNKINETIC ER) 10-8 MG/5ML LQCR Take 5 mLs by mouth every 12 (twelve) hours as needed. 04/07/13   Carmelina DaneJeffery S Anderson, MD  guaiFENesin-codeine 100-10 MG/5ML syrup Take 5 mLs by mouth 3 (three) times daily as needed for cough. 04/06/14   Roxy Horsemanobert Deunte Bledsoe, PA-C  oxyCODONE-acetaminophen (PERCOCET/ROXICET) 5-325 MG per tablet Take 1-2 tablets by mouth every 4 (four) hours as needed for severe pain. 11/14/13   Toy BakerAnthony T Allen, MD  predniSONE (DELTASONE) 10 MG tablet Take 2 tablets (20 mg total) by mouth daily. 11/14/13   Toy BakerAnthony T Allen, MD   BP 114/63 mmHg  Pulse 100  Temp(Src) 98.3 F (36.8 C) (Oral)  Resp 18  Ht 5\' 3"  (1.6 m)  Wt 136 lb (61.689 kg)  BMI 24.10 kg/m2  SpO2 100% Physical Exam  Constitutional: She is oriented to person, place, and time. She appears well-developed and well-nourished.  HENT:  Head: Normocephalic and atraumatic.   Oropharynx mildly erythematous  Eyes: Conjunctivae and EOM are normal. Pupils are equal, round, and reactive to light.  Neck: Normal range of motion. Neck supple.  Cardiovascular: Normal rate and regular rhythm.  Exam reveals no gallop and no friction rub.   No murmur  heard. Pulmonary/Chest: Effort normal. No respiratory distress. She has no wheezes. She has rales. She exhibits no tenderness.   Crackles heard in left lung base  Abdominal: Soft. Bowel sounds are normal. She exhibits no distension and no mass. There is no tenderness. There is no rebound and no guarding.  Musculoskeletal: Normal range of motion. She exhibits no edema or tenderness.  Neurological: She is alert and oriented to person, place, and time.  Skin: Skin is warm and dry.  Psychiatric: She has a normal mood and affect. Her behavior is normal. Judgment and thought content normal.  Nursing note and vitals reviewed.   ED Course  Procedures (including critical care time) Labs Review Labs Reviewed - No data to display  Imaging Review Dg Chest 2 View  04/06/2014   CLINICAL DATA:  Cough since last Tuesday. Wheezing, body aches. History of bronchitis  EXAM: CHEST  2 VIEW  COMPARISON:  03/10/2011  FINDINGS: The heart size and mediastinal contours are within normal limits. Both lungs are clear. The visualized skeletal structures are unremarkable.  IMPRESSION: No active cardiopulmonary disease.   Electronically Signed   By: Charlett NoseKevin  Dover M.D.   On: 04/06/2014 17:10     EKG Interpretation None      MDM   Final diagnoses:  Cough  URI, acute    patient with probable URI, chest x-ray is clear, however left lung bases remarkable for significant crackles, as patient's symptoms have been worsening, I feel that it is appropriate to cover with azithromycin. Recommend primary care follow-up. I will also discharge the patient with cough medicine and an inhaler. Patient understands agrees with the plan. She is stable and ready for discharge.    Roxy Horsemanobert Yarithza Mink, PA-C 04/06/14 1729  Arby BarretteMarcy Pfeiffer, MD 04/06/14 [phone]

## 2014-04-06 NOTE — ED Notes (Signed)
Hx of Bronchitis

## 2015-12-13 ENCOUNTER — Emergency Department (HOSPITAL_BASED_OUTPATIENT_CLINIC_OR_DEPARTMENT_OTHER): Payer: BLUE CROSS/BLUE SHIELD

## 2015-12-13 ENCOUNTER — Emergency Department (HOSPITAL_BASED_OUTPATIENT_CLINIC_OR_DEPARTMENT_OTHER)
Admission: EM | Admit: 2015-12-13 | Discharge: 2015-12-13 | Disposition: A | Discharge status: Home / Self Care | Payer: BLUE CROSS/BLUE SHIELD | Attending: Emergency Medicine | Admitting: Emergency Medicine

## 2015-12-13 ENCOUNTER — Encounter (HOSPITAL_BASED_OUTPATIENT_CLINIC_OR_DEPARTMENT_OTHER): Payer: Self-pay | Admitting: *Deleted

## 2015-12-13 DIAGNOSIS — R110 Nausea: Secondary | ICD-10-CM | POA: Insufficient documentation

## 2015-12-13 DIAGNOSIS — R1013 Epigastric pain: Secondary | ICD-10-CM

## 2015-12-13 DIAGNOSIS — R079 Chest pain, unspecified: Secondary | ICD-10-CM | POA: Diagnosis not present

## 2015-12-13 DIAGNOSIS — F17210 Nicotine dependence, cigarettes, uncomplicated: Secondary | ICD-10-CM | POA: Diagnosis not present

## 2015-12-13 DIAGNOSIS — K5900 Constipation, unspecified: Secondary | ICD-10-CM | POA: Insufficient documentation

## 2015-12-13 LAB — URINALYSIS, ROUTINE W REFLEX MICROSCOPIC
BILIRUBIN URINE: NEGATIVE
Glucose, UA: NEGATIVE mg/dL
Hgb urine dipstick: NEGATIVE
Ketones, ur: NEGATIVE mg/dL
Leukocytes, UA: NEGATIVE
NITRITE: NEGATIVE
PH: 6 (ref 5.0–8.0)
Protein, ur: NEGATIVE mg/dL
SPECIFIC GRAVITY, URINE: 1.02 (ref 1.005–1.030)

## 2015-12-13 LAB — CBC WITH DIFFERENTIAL/PLATELET
Basophils Absolute: 0 10*3/uL (ref 0.0–0.1)
Basophils Relative: 0 %
Eosinophils Absolute: 0.1 10*3/uL (ref 0.0–0.7)
Eosinophils Relative: 1 %
HEMATOCRIT: 39.8 % (ref 36.0–46.0)
HEMOGLOBIN: 13.5 g/dL (ref 12.0–15.0)
LYMPHS ABS: 3.6 10*3/uL (ref 0.7–4.0)
LYMPHS PCT: 36 %
MCH: 32.3 pg (ref 26.0–34.0)
MCHC: 33.9 g/dL (ref 30.0–36.0)
MCV: 95.2 fL (ref 78.0–100.0)
MONOS PCT: 8 %
Monocytes Absolute: 0.8 10*3/uL (ref 0.1–1.0)
NEUTROS ABS: 5.6 10*3/uL (ref 1.7–7.7)
NEUTROS PCT: 55 %
Platelets: 157 10*3/uL (ref 150–400)
RBC: 4.18 MIL/uL (ref 3.87–5.11)
RDW: 12.2 % (ref 11.5–15.5)
WBC: 10.1 10*3/uL (ref 4.0–10.5)

## 2015-12-13 LAB — COMPREHENSIVE METABOLIC PANEL
ALK PHOS: 57 U/L (ref 38–126)
ALT: 19 U/L (ref 14–54)
ANION GAP: 6 (ref 5–15)
AST: 20 U/L (ref 15–41)
Albumin: 4.1 g/dL (ref 3.5–5.0)
BILIRUBIN TOTAL: 0.7 mg/dL (ref 0.3–1.2)
BUN: 8 mg/dL (ref 6–20)
CALCIUM: 9.1 mg/dL (ref 8.9–10.3)
CO2: 23 mmol/L (ref 22–32)
CREATININE: 0.54 mg/dL (ref 0.44–1.00)
Chloride: 108 mmol/L (ref 101–111)
GFR calc non Af Amer: >60 mL/min (ref >60)
Glucose, Bld: 85 mg/dL (ref 65–99)
Potassium: 3.6 mmol/L (ref 3.5–5.1)
SODIUM: 137 mmol/L (ref 135–145)
TOTAL PROTEIN: 7 g/dL (ref 6.5–8.1)

## 2015-12-13 LAB — LIPASE, BLOOD: Lipase: 38 U/L (ref 11–51)

## 2015-12-13 MED ORDER — FAMOTIDINE IN NACL 20-0.9 MG/50ML-% IV SOLN
20.0000 mg | Freq: Once | INTRAVENOUS | Status: AC
  Administered 2015-12-13: 20 mg via INTRAVENOUS
  Filled 2015-12-13: qty 50

## 2015-12-13 MED ORDER — ONDANSETRON HCL 8 MG PO TABS: 8.0000 mg | ORAL_TABLET | Freq: Three times a day (TID) | ORAL | 0 refills | Status: AC | PRN

## 2015-12-13 MED ORDER — SODIUM CHLORIDE 0.9 % IV BOLUS (SEPSIS)
500.0000 mL | Freq: Once | INTRAVENOUS | Status: AC
  Administered 2015-12-13: 500 mL via INTRAVENOUS

## 2015-12-13 MED ORDER — GI COCKTAIL ~~LOC~~
30.0000 mL | Freq: Once | ORAL | Status: AC
  Administered 2015-12-13: 30 mL via ORAL
  Filled 2015-12-13: qty 30

## 2015-12-13 MED ORDER — FAMOTIDINE 20 MG PO TABS: 20.0000 mg | ORAL_TABLET | Freq: Two times a day (BID) | ORAL | 3 refills | Status: AC

## 2015-12-13 MED ORDER — ONDANSETRON HCL 4 MG/2ML IJ SOLN
4.0000 mg | Freq: Once | INTRAMUSCULAR | Status: AC
  Administered 2015-12-13: 4 mg via INTRAVENOUS
  Filled 2015-12-13: qty 2

## 2015-12-13 NOTE — ED Triage Notes (Signed)
Nausea, lightheaded and sharp pain in her chest. States she was seen 2 days ago in [place] for abdominal pain. She had test to r/o appendicitis and gallbladder. She was diagnosed with inflammation of her stomach and given Tramadol and Naproxen.

## 2015-12-13 NOTE — Discharge Instructions (Signed)
Please take Pepcid twice daily for your abdominal pain and Zofran every 8 hours as needed for nausea. Please stop taking naproxen. Please call [facility] to schedule an appointment with a stomach doctor for further work up. If symptoms worsen, please return for evaluation.

## 2015-12-13 NOTE — ED Provider Notes (Signed)
[facility] Provider Note   CSN: [id number] Arrival date & time: 12/13/15  2115  First Provider Contact:  First MD Initiated Contact with Patient 12/13/15 2130        History   Chief Complaint Chief Complaint  Patient presents with  . Abdominal Pain    HPI Tammy Berry is a 45 y.o. female.  Patient presents today with complaint of epigastric abdominal pain and nausea for 1 week. Patient was evaluated by an urgent care in [place] 2 days ago for similar complaints and was sent to the hospital for further work up at that time. She said she underwent imaging of her gallbladder and appendix which were both normal. She was found to have a small ovarian cyst with some surrounding inflammation and sent home on naproxen. Abdominal pain and nausea have continued and she presented today for further evaluation. She said the pain has now migrated into her mid chest and is sharp in nature. It is intermittent, non positional, non exertional, and non radiating. She denies any relation to food. She also endorses constipation over this time period for which she has been taking over the counter laxatives. Last BM was 4 days ago. She denies emesis. No SOB or trouble breathing. Denies fever and cough.       Past Medical History:  Diagnosis Date  . GERD (gastroesophageal reflux disease)     There are no active problems to display for this patient.   Past Surgical History:  Procedure Laterality Date  . ABDOMINAL HYSTERECTOMY  2009  . TONSILLECTOMY    . TONSILLECTOMY Bilateral   . TUBAL LIGATION      OB History    No data available       Home Medications    Prior to Admission medications   Medication Sig Start Date End Date Taking? Authorizing Provider  albuterol (PROVENTIL HFA;VENTOLIN HFA) 108 (90 BASE) MCG/ACT inhaler Inhale 1-2 puffs into the lungs every 4 (four) hours as needed for wheezing. 04/06/14 04/06/15  Roxy Horseman, PA-C  azithromycin (ZITHROMAX) 250 MG  tablet Take 2 tabs PO x 1 dose, then 1 tab PO QD x 4 days 04/06/14   Roxy Horseman, PA-C  chlorpheniramine-HYDROcodone [facility] ER) 10-8 MG/5ML LQCR Take 5 mLs by mouth every 12 (twelve) hours as needed. 03/10/11   Marisa Severin, MD  chlorpheniramine-HYDROcodone [facility] PENNKINETIC ER) 10-8 MG/5ML LQCR Take 5 mLs by mouth every 12 (twelve) hours as needed. 04/07/13   Carmelina Dane, MD  famotidine (PEPCID) 20 MG tablet Take 1 tablet (20 mg total) by mouth 2 (two) times daily. 12/13/15   Reymundo Poll, MD  guaiFENesin-codeine 100-10 MG/5ML syrup Take 5 mLs by mouth 3 (three) times daily as needed for cough. 04/06/14   Roxy Horseman, PA-C  omeprazole (PRILOSEC) 40 MG capsule Take 1 capsule (40 mg total) by mouth daily. 09/07/12   Peyton Najjar, MD  ondansetron (ZOFRAN) 8 MG tablet Take 1 tablet (8 mg total) by mouth every 8 (eight) hours as needed for nausea or vomiting. 12/13/15   Reymundo Poll, MD  oxyCODONE-acetaminophen (PERCOCET/ROXICET) 5-325 MG per tablet Take 1-2 tablets by mouth every 4 (four) hours as needed for severe pain. 11/14/13   Lorre Nick, MD  predniSONE (DELTASONE) 10 MG tablet Take 2 tablets (20 mg total) by mouth daily. 11/14/13   Lorre Nick, MD    Family History Family History  Problem Relation Age of Onset  . Hypertension Mother   . Breast cancer Mother  Social History Social History  Substance Use Topics  . Smoking status: Current Every Day Smoker    Packs/day: 0.50    Types: Cigarettes  . Smokeless tobacco: Never Used  . Alcohol use Yes     Comment: occ     Allergies   Hydrocodone   Review of Systems Review of Systems  Constitutional: Negative.   HENT: Negative.   Eyes: Negative.   Respiratory: Negative.   Cardiovascular: Positive for chest pain. Negative for palpitations and leg swelling.  Gastrointestinal: Positive for abdominal pain, constipation and nausea. Negative for blood in stool and vomiting.  Endocrine: Negative.    Genitourinary: Negative.  Negative for dysuria.  Musculoskeletal: Negative.   Skin: Negative.   Allergic/Immunologic: Negative.   Neurological: Negative.   Hematological: Negative.   Psychiatric/Behavioral: Negative.      Physical Exam Updated Vital Signs BP 112/59   Pulse 71   Temp 98 F (36.7 C) (Oral)   Resp 20   Ht 5\' 3"  (1.6 m)   Wt 65.8 kg   SpO2 98%   BMI 25.69 kg/m   Physical Exam  Constitutional: She appears well-developed and well-nourished. No distress.  HENT:  Head: Normocephalic and atraumatic.  Eyes: Conjunctivae are normal.  Neck: Neck supple.  Cardiovascular: Normal rate and regular rhythm.   No murmur heard. Pulmonary/Chest: Effort normal and breath sounds normal. No respiratory distress.  Abdominal: Soft. There is no tenderness.  Tender to palpation over mid epigastrium   Musculoskeletal: She exhibits no edema.  Neurological: She is alert.  Skin: Skin is warm and dry.  Psychiatric: She has a normal mood and affect.  Nursing note and vitals reviewed.    ED Treatments / Results  Labs (all labs ordered are listed, but only abnormal results are displayed) Labs Reviewed  URINALYSIS, ROUTINE W REFLEX MICROSCOPIC (NOT AT [facility])  CBC WITH DIFFERENTIAL/PLATELET  COMPREHENSIVE METABOLIC PANEL  LIPASE, BLOOD    EKG  EKG Interpretation None       Radiology Dg Abdomen 1 View  Result Date: 12/13/2015 CLINICAL DATA:  Abdominal pain for 2 days with nausea. EXAM: ABDOMEN - 1 VIEW COMPARISON:  None. FINDINGS: The bowel gas pattern is normal. No radio-opaque calculi or other significant radiographic abnormality are seen. IMPRESSION: Negative. Electronically Signed   By: Harmon Pier M.D.   On: 12/13/2015 22:45    Procedures Procedures (including critical care time)  Medications Ordered in ED Medications  gi cocktail (Maalox,Lidocaine,Donnatal) (30 mLs Oral Given 12/13/15 2153)  sodium chloride 0.9 % bolus 500 mL (0 mLs Intravenous Stopped 12/13/15  2323)  ondansetron (ZOFRAN) injection 4 mg (4 mg Intravenous Given 12/13/15 2159)  famotidine (PEPCID) IVPB 20 mg premix (0 mg Intravenous Stopped 12/13/15 2336)     Initial Impression / Assessment and Plan / ED Course  I have reviewed the triage vital signs and the nursing notes.  Pertinent labs & imaging results that were available during my care of the patient were reviewed by me and considered in my medical decision making (see chart for details).  Clinical Course  Value Comment By Time  DG Abdomen 1 View (Reviewed) Reymundo Poll, MD 08/10 2251   Abdominal pain/nausea: Etiology unclear. Given GI cocktail, IV Pepcid, and IV zofran with some relief of symptoms. Lipase, CMP, CBC, and UA all within normal limits. KUB normal. Symptoms possibly due to reflux. Given prescription for Pepcid BID and Zofran prn for nausea. Also given GI referral information and instructed to call to schedule an appointment for  further work up.   Final Clinical Impressions(s) / ED Diagnoses   Final diagnoses:  None    New Prescriptions Discharge Medication List as of 12/13/2015 11:10 PM    START taking these medications   Details  famotidine (PEPCID) 20 MG tablet Take 1 tablet (20 mg total) by mouth 2 (two) times daily., Starting Thu 12/13/2015, Print    ondansetron (ZOFRAN) 8 MG tablet Take 1 tablet (8 mg total) by mouth every 8 (eight) hours as needed for nausea or vomiting., Starting Thu 12/13/2015, Print         Reymundo Pollarolyn Dionicia Cerritos, MD 12/13/15 [id number]    Reymundo Pollarolyn Jamarious Febo, MD 12/14/15 [id number]    Nelva Nayobert Beaton, MD 12/14/15 1620

## 2015-12-17 ENCOUNTER — Encounter: Payer: Self-pay | Admitting: Gastroenterology

## 2016-02-22 ENCOUNTER — Ambulatory Visit: Payer: BLUE CROSS/BLUE SHIELD | Admitting: Gastroenterology

## 2017-03-05 ENCOUNTER — Encounter (HOSPITAL_BASED_OUTPATIENT_CLINIC_OR_DEPARTMENT_OTHER): Payer: Self-pay | Admitting: Emergency Medicine

## 2017-03-05 ENCOUNTER — Emergency Department (HOSPITAL_BASED_OUTPATIENT_CLINIC_OR_DEPARTMENT_OTHER)
Admission: EM | Admit: 2017-03-05 | Discharge: 2017-03-05 | Disposition: A | Discharge status: Home / Self Care | Payer: BLUE CROSS/BLUE SHIELD | Attending: Emergency Medicine | Admitting: Emergency Medicine

## 2017-03-05 ENCOUNTER — Emergency Department (HOSPITAL_BASED_OUTPATIENT_CLINIC_OR_DEPARTMENT_OTHER): Payer: BLUE CROSS/BLUE SHIELD

## 2017-03-05 DIAGNOSIS — Z79899 Other long term (current) drug therapy: Secondary | ICD-10-CM | POA: Diagnosis not present

## 2017-03-05 DIAGNOSIS — F17210 Nicotine dependence, cigarettes, uncomplicated: Secondary | ICD-10-CM | POA: Diagnosis not present

## 2017-03-05 DIAGNOSIS — M25511 Pain in right shoulder: Secondary | ICD-10-CM

## 2017-03-05 MED ORDER — CYCLOBENZAPRINE HCL 5 MG PO TABS: 10.0000 mg | ORAL_TABLET | Freq: Two times a day (BID) | ORAL | 0 refills | Status: AC | PRN

## 2017-03-05 MED ORDER — PREDNISONE 10 MG (21) PO TBPK: ORAL_TABLET | Freq: Every day | ORAL | 0 refills | Status: AC

## 2017-03-05 MED ORDER — MELOXICAM 7.5 MG PO TABS: 7.5000 mg | ORAL_TABLET | Freq: Every day | ORAL | 0 refills | Status: AC

## 2017-03-05 MED FILL — predniSONE 10 MG TABS: 10 | 12 days supply | Qty: 42 | Fill #0

## 2017-03-05 MED FILL — MELOXICAM 7.5 MG TABLET: 7.5 | 30 days supply | Qty: 30 | Fill #0

## 2017-03-05 MED FILL — CYCLOBENZAPRINE 5 MG TABLET: 5 | 4 days supply | Qty: 15 | Fill #0

## 2017-03-05 NOTE — ED Provider Notes (Signed)
[facility] EMERGENCY DEPARTMENT Provider Note   CSN: [id number] Arrival date & time: 03/05/17  1521     History   Chief Complaint Chief Complaint  Patient presents with  . Shoulder Pain    HPI Tammy Berry is a 46 y.o. female presenting with right shoulder pain.  Patient states that for the past 1-2 years, she has had right shoulder pain.  This has been worsening over the past 2 weeks.  She reports pain begins at her shoulder and travels down her arm into her hand.  The pain is a throbbing, and it is now constant.  Pain is worse with movement.  Nothing has made it better, including daily meloxicam.  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  She denies fall, trauma, or injury of the shoulder.  She denies numbness or tingling.  She denies history of shoulder problems.  She denies decreased grip strength, or dropping things.  She denies redness, warmth, or swelling of the shoulder.  She denies fevers or chills.   HPI  Past Medical History:  Diagnosis Date  . GERD (gastroesophageal reflux disease)     There are no active problems to display for this patient.   Past Surgical History:  Procedure Laterality Date  . ABDOMINAL HYSTERECTOMY  2009  . TONSILLECTOMY    . TONSILLECTOMY Bilateral   . TUBAL LIGATION      OB History    No data available       Home Medications    Prior to Admission medications   Medication Sig Start Date End Date Taking? Authorizing Provider  albuterol (PROVENTIL HFA;VENTOLIN HFA) 108 (90 BASE) MCG/ACT inhaler Inhale 1-2 puffs into the lungs every 4 (four) hours as needed for wheezing. 04/06/14 04/06/15  Roxy Horseman, PA-C  azithromycin (ZITHROMAX) 250 MG tablet Take 2 tabs PO x 1 dose, then 1 tab PO QD x 4 days 04/06/14   Roxy Horseman, PA-C  chlorpheniramine-HYDROcodone [facility] ER) 10-8 MG/5ML LQCR Take 5  mLs by mouth every 12 (twelve) hours as needed. 03/10/11   Marisa Severin, MD  chlorpheniramine-HYDROcodone (TUSSIONEX PENNKINETIC ER) 10-8 MG/5ML LQCR Take 5 mLs by mouth every 12 (twelve) hours as needed. 04/07/13   Carmelina Dane, MD  cyclobenzaprine (FLEXERIL) 5 MG tablet Take 2 tablets (10 mg total) by mouth 2 (two) times daily as needed for muscle spasms. 03/05/17   Tiena Manansala, PA-C  famotidine (PEPCID) 20 MG tablet Take 1 tablet (20 mg total) by mouth 2 (two) times daily. 12/13/15   Reymundo Poll, MD  guaiFENesin-codeine 100-10 MG/5ML syrup Take 5 mLs by mouth 3 (three) times daily as needed for cough. 04/06/14   Roxy Horseman, PA-C  meloxicam (MOBIC) 7.5 MG tablet Take 1 tablet (7.5 mg total) by mouth daily. 03/05/17   Arrin Ishler, PA-C  omeprazole (PRILOSEC) 40 MG capsule Take 1 capsule (40 mg total) by mouth daily. 09/07/12   Peyton Najjar, MD  ondansetron (ZOFRAN) 8 MG tablet Take 1 tablet (8 mg total) by mouth every 8 (eight) hours as needed for nausea or vomiting. 12/13/15   Reymundo Poll, MD  oxyCODONE-acetaminophen (PERCOCET/ROXICET) 5-325 MG per tablet Take 1-2 tablets by mouth every 4 (four) hours as needed for severe pain. 11/14/13   Lorre Nick, MD  predniSONE (DELTASONE) 10 MG tablet Take 2 tablets (20 mg total) by mouth daily. 11/14/13   Lorre Nick, MD  predniSONE (STERAPRED UNI-PAK 21 TAB) 10 MG (21) TBPK tablet Take by mouth daily. Take 6 tabs by mouth daily  for 2 days, then 5 tabs for 2 days, then 4 tabs for 2 days, then 3 tabs for 2 days, 2 tabs for 2 days, then 1 tab by mouth daily for 2 days 03/05/17   Kahli Fitzgerald, PA-C    Family History Family History  Problem Relation Age of Onset  . Hypertension Mother   . Breast cancer Mother     Social History Social History  Substance Use Topics  . Smoking status: Current Every Day Smoker    Packs/day: 0.50    Types: Cigarettes  . Smokeless tobacco: Never Used  . Alcohol use Yes     Comment:  occ     Allergies   Hydrocodone   Review of Systems Review of Systems  Musculoskeletal: Positive for arthralgias.  Skin: Negative for wound.  Neurological: Negative for numbness.  Hematological: Does not bruise/bleed easily.     Physical Exam Updated Vital Signs BP 115/80 (BP Location: Left Arm)   Pulse 82   Temp 98.3 F (36.8 C) (Oral)   Resp 18   SpO2 100%   Physical Exam  Constitutional: She is oriented to person, place, and time. She appears well-developed and well-nourished. No distress.  HENT:  Head: Normocephalic and atraumatic.  Eyes: EOM are normal.  Neck: Normal range of motion.  Pulmonary/Chest: Effort normal.  Abdominal: She exhibits no distension.  Musculoskeletal: She exhibits tenderness.  No TTP of R shoulder. Pain with passive and active ROM, full ROM. Radial pulses equal bilaterally. Strength equal bilaterally. Sensation intact bilaterally. No TTP of the neck, no c-spine tenderness. No erythema, swelling or warmth of the shoulder. No obvious deformity, contusion or injury.  Neurological: She is alert and oriented to person, place, and time. No sensory deficit.  Skin: Skin is warm. No rash noted.  Psychiatric: She has a normal mood and affect.  Nursing note and vitals reviewed.    ED Treatments / Results  Labs (all labs ordered are listed, but only abnormal results are displayed) Labs Reviewed - No data to display  EKG  EKG Interpretation None       Radiology Dg Shoulder Right  Result Date: 03/05/2017 CLINICAL DATA:  46 y/o F; right shoulder pain radiating to the hand. EXAM: RIGHT SHOULDER - 2+ VIEW COMPARISON:  None. FINDINGS: There is no evidence of fracture or dislocation. There is no evidence of arthropathy or other focal bone abnormality. Soft tissues are unremarkable. IMPRESSION: Negative. Electronically Signed   By: Mitzi Hansen M.D.   On: 03/05/2017 15:59    Procedures Procedures (including critical care  time)  Medications Ordered in ED Medications - No data to display   Initial Impression / Assessment and Plan / ED Course  I have reviewed the triage vital signs and the nursing notes.  Pertinent labs & imaging results that were available during my care of the patient were reviewed by me and considered in my medical decision making (see chart for details).     Patient presenting with 1-2-year history of right shoulder pain, worse over the past several weeks.  Physical exam shows patient neurovascularly intact with pain with movement of the arm.  No tenderness to palpation at rest.  This has not been improving with NSAIDs.  X-ray negative for fracture, dislocation, or other abnormality such as OA.  Pain likely muscular in origin.  Will give prednisone pack and have patient  follow-up with Ortho.  At this time, patient appears safe for discharge.  Return precautions given.  Patient states she understands and agrees to plan.   Final Clinical Impressions(s) / ED Diagnoses   Final diagnoses:  Acute pain of right shoulder    New Prescriptions Discharge Medication List as of 03/05/2017  4:26 PM    START taking these medications   Details  cyclobenzaprine (FLEXERIL) 5 MG tablet Take 2 tablets (10 mg total) by mouth 2 (two) times daily as needed for muscle spasms., Starting Thu 03/05/2017, Print    meloxicam (MOBIC) 7.5 MG tablet Take 1 tablet (7.5 mg total) by mouth daily., Starting Thu 03/05/2017, Print    predniSONE (STERAPRED UNI-PAK 21 TAB) 10 MG (21) TBPK tablet Take by mouth daily. Take 6 tabs by mouth daily  for 2 days, then 5 tabs for 2 days, then 4 tabs for 2 days, then 3 tabs for 2 days, 2 tabs for 2 days, then 1 tab by mouth daily for 2 days, Starting Thu 03/05/2017, Print         Golden Beachacc[place], HelixSophia, PA-C 03/05/17 1725    Gwyneth SproutPlunkett, Whitney, MD 03/05/17 2302

## 2017-03-05 NOTE — ED Triage Notes (Signed)
PT presents with c/o right shoulder pain for over a week now but getting worse. PT sts the pain is going down her arm into her hand.

## 2017-03-05 NOTE — Discharge Instructions (Signed)
Take prednisone as prescribed. You may use Tylenol as needed for pain.  You may take up to 1000 mg at a time 3 times a day. Do not use anti-inflammatories (Advil, Motrin, naproxen, Aleve, ibuprofen, meloxicam) while taking prednisone.  Start taking meloxicam once daily after you finish prednisone. Use the muscle relaxer twice a day as needed for muscle pain or stiffness. Follow-up with orthopedic doctor for further evaluation of your shoulder. Return to the emergency department if you develop numbness, fevers, redness of the shoulder, or any new or worsening symptoms.

## 2020-12-13 ENCOUNTER — Other Ambulatory Visit: Payer: Self-pay

## 2020-12-13 ENCOUNTER — Emergency Department (HOSPITAL_BASED_OUTPATIENT_CLINIC_OR_DEPARTMENT_OTHER)
Admission: EM | Admit: 2020-12-13 | Discharge: 2020-12-13 | Disposition: A | Discharge status: Home / Self Care | Payer: BC Managed Care – PPO | Attending: Emergency Medicine | Admitting: Emergency Medicine

## 2020-12-13 ENCOUNTER — Emergency Department (HOSPITAL_BASED_OUTPATIENT_CLINIC_OR_DEPARTMENT_OTHER): Payer: BC Managed Care – PPO

## 2020-12-13 ENCOUNTER — Other Ambulatory Visit (HOSPITAL_BASED_OUTPATIENT_CLINIC_OR_DEPARTMENT_OTHER): Payer: Self-pay

## 2020-12-13 ENCOUNTER — Encounter (HOSPITAL_BASED_OUTPATIENT_CLINIC_OR_DEPARTMENT_OTHER): Payer: Self-pay | Admitting: Emergency Medicine

## 2020-12-13 DIAGNOSIS — F17210 Nicotine dependence, cigarettes, uncomplicated: Secondary | ICD-10-CM | POA: Insufficient documentation

## 2020-12-13 DIAGNOSIS — R102 Pelvic and perineal pain: Secondary | ICD-10-CM | POA: Insufficient documentation

## 2020-12-13 LAB — URINALYSIS, ROUTINE W REFLEX MICROSCOPIC
Bilirubin Urine: NEGATIVE
Glucose, UA: NEGATIVE mg/dL
Ketones, ur: NEGATIVE mg/dL
Leukocytes,Ua: NEGATIVE
Nitrite: NEGATIVE
Protein, ur: NEGATIVE mg/dL
Specific Gravity, Urine: 1.01 (ref 1.005–1.030)
pH: 7 (ref 5.0–8.0)

## 2020-12-13 LAB — CBC WITH DIFFERENTIAL/PLATELET
Abs Immature Granulocytes: 0.02 10*3/uL (ref 0.00–0.07)
Basophils Absolute: 0 10*3/uL (ref 0.0–0.1)
Basophils Relative: 0 %
Eosinophils Absolute: 0.1 10*3/uL (ref 0.0–0.5)
Eosinophils Relative: 1 %
HCT: 42.2 % (ref 36.0–46.0)
Hemoglobin: 14 g/dL (ref 12.0–15.0)
Immature Granulocytes: 0 %
Lymphocytes Relative: 30 %
Lymphs Abs: 3.1 10*3/uL (ref 0.7–4.0)
MCH: 31.7 pg (ref 26.0–34.0)
MCHC: 33.2 g/dL (ref 30.0–36.0)
MCV: 95.5 fL (ref 80.0–100.0)
Monocytes Absolute: 0.6 10*3/uL (ref 0.1–1.0)
Monocytes Relative: 6 %
Neutro Abs: 6.7 10*3/uL (ref 1.7–7.7)
Neutrophils Relative %: 63 %
Platelets: 196 10*3/uL (ref 150–400)
RBC: 4.42 MIL/uL (ref 3.87–5.11)
RDW: 12.5 % (ref 11.5–15.5)
WBC: 10.4 10*3/uL (ref 4.0–10.5)
nRBC: 0 % (ref 0.0–0.2)

## 2020-12-13 LAB — URINALYSIS, MICROSCOPIC (REFLEX)

## 2020-12-13 LAB — COMPREHENSIVE METABOLIC PANEL
ALT: 17 U/L (ref 0–44)
AST: 21 U/L (ref 15–41)
Albumin: 4.6 g/dL (ref 3.5–5.0)
Alkaline Phosphatase: 81 U/L (ref 38–126)
Anion gap: 7 (ref 5–15)
BUN: 9 mg/dL (ref 6–20)
CO2: 26 mmol/L (ref 22–32)
Calcium: 9.7 mg/dL (ref 8.9–10.3)
Chloride: 106 mmol/L (ref 98–111)
Creatinine, Ser: 0.69 mg/dL (ref 0.44–1.00)
GFR, Estimated: >60 mL/min (ref >60)
Glucose, Bld: 99 mg/dL (ref 70–99)
Potassium: 4.1 mmol/L (ref 3.5–5.1)
Sodium: 139 mmol/L (ref 135–145)
Total Bilirubin: 0.4 mg/dL (ref 0.3–1.2)
Total Protein: 8 g/dL (ref 6.5–8.1)

## 2020-12-13 LAB — LIPASE, BLOOD: Lipase: 32 U/L (ref 11–51)

## 2020-12-13 MED ORDER — TRAMADOL HCL 50 MG PO TABS
25.0000 mg | ORAL_TABLET | Freq: Four times a day (QID) | ORAL | 0 refills | Status: AC | PRN
  Filled 2020-12-13: qty 15, 4d supply, fill #0

## 2020-12-13 MED ORDER — IOHEXOL 300 MG/ML  SOLN
100.0000 mL | Freq: Once | INTRAMUSCULAR | Status: AC | PRN
  Administered 2020-12-13: 100 mL via INTRAVENOUS

## 2020-12-13 NOTE — Discharge Instructions (Addendum)
Contact a health care provider if: Medicine does not help your pain. Your pain comes back. You have new symptoms. You have abnormal vaginal discharge or bleeding, including bleeding after menopause. You have a fever or chills. You are constipated. You have blood in your urine or stool. You have foul-smelling urine. You feel weak or light-headed. Get help right away if: You have sudden severe pain. Your pain gets steadily worse. You have severe pain along with fever, nausea, vomiting, or excessive sweating. You lose consciousness. 

## 2020-12-13 NOTE — ED Triage Notes (Signed)
Patient having pelvic pain x 1 month. Persistent and stronger today. No medications taken today. Partial hysterectomy r/t fibroids approx 13 years ago.

## 2020-12-13 NOTE — ED Provider Notes (Signed)
[facility] EMERGENCY DEPARTMENT Provider Note   CSN: [id number] Arrival date & time: 12/13/20  4742     History Chief Complaint  Patient presents with   Pelvic Pain    Tammy Berry is a 50 y.o. female who presents emergency department a chief complaint of pelvic pain.  She is status post partial hysterectomy.  She has a history of ovarian cyst.  Patient states that she has had intermittent bilateral sharp pelvic pain ongoing for the several months.  Patient states that today her pain has been persistent, waxing and waning at times quite severe.  She denies any urinary symptoms, vaginal discharge, back pain.  The pain does not radiate.  She has used naproxen in the past with relief however it did not work for her today.  She is concerned she may have a recurrence of her ovarian cysts.  She has a sense of fullness in her pelvic region.  She has no other complaints at this time.  The history is provided by the patient. No language interpreter was used.  Pelvic Pain      Past Medical History:  Diagnosis Date   GERD (gastroesophageal reflux disease)     There are no problems to display for this patient.   Past Surgical History:  Procedure Laterality Date   ABDOMINAL HYSTERECTOMY  2009   TONSILLECTOMY     TONSILLECTOMY Bilateral    TUBAL LIGATION       OB History   No obstetric history on file.     Family History  Problem Relation Age of Onset   Hypertension Mother    Breast cancer Mother     Social History   Tobacco Use   Smoking status: Every Day    Packs/day: 0.50    Types: Cigarettes   Smokeless tobacco: Never  Substance Use Topics   Alcohol use: Yes    Comment: occ   Drug use: No    Home Medications Prior to Admission medications   Medication Sig Start Date End Date Taking? Authorizing Provider  albuterol (PROVENTIL HFA;VENTOLIN HFA) 108 (90 BASE) MCG/ACT inhaler Inhale 1-2 puffs into the lungs every 4 (four) hours as needed for wheezing.  04/06/14 04/06/15  Roxy Horseman, PA-C  azithromycin (ZITHROMAX) 250 MG tablet Take 2 tabs PO x 1 dose, then 1 tab PO QD x 4 days 04/06/14   Roxy Horseman, PA-C  chlorpheniramine-HYDROcodone [facility]) 10-8 MG/5ML LQCR Take 5 mLs by mouth every 12 (twelve) hours as needed. 03/10/11   Marisa Severin, MD  chlorpheniramine-HYDROcodone (TUSSIONEX PENNKINETIC ER) 10-8 MG/5ML LQCR Take 5 mLs by mouth every 12 (twelve) hours as needed. 04/07/13   Carmelina Dane, MD  cyclobenzaprine (FLEXERIL) 5 MG tablet Take 2 tablets (10 mg total) by mouth 2 (two) times daily as needed for muscle spasms. 03/05/17   Caccavale, Sophia, PA-C  famotidine (PEPCID) 20 MG tablet Take 1 tablet (20 mg total) by mouth 2 (two) times daily. 12/13/15   Reymundo Poll, MD  guaiFENesin-codeine 100-10 MG/5ML syrup Take 5 mLs by mouth 3 (three) times daily as needed for cough. 04/06/14   Roxy Horseman, PA-C  meloxicam (MOBIC) 7.5 MG tablet Take 1 tablet (7.5 mg total) by mouth daily. 03/05/17   Caccavale, Sophia, PA-C  omeprazole (PRILOSEC) 40 MG capsule Take 1 capsule (40 mg total) by mouth daily. 09/07/12   Peyton Najjar, MD  ondansetron (ZOFRAN) 8 MG tablet Take 1 tablet (8 mg total) by mouth every 8 (eight) hours as  needed for nausea or vomiting. 12/13/15   Reymundo Poll, MD  oxyCODONE-acetaminophen (PERCOCET/ROXICET) 5-325 MG per tablet Take 1-2 tablets by mouth every 4 (four) hours as needed for severe pain. 11/14/13   Lorre Nick, MD  predniSONE (DELTASONE) 10 MG tablet Take 2 tablets (20 mg total) by mouth daily. 11/14/13   Lorre Nick, MD  predniSONE (STERAPRED UNI-PAK 21 TAB) 10 MG (21) TBPK tablet Take by mouth daily. Take 6 tabs by mouth daily  for 2 days, then 5 tabs for 2 days, then 4 tabs for 2 days, then 3 tabs for 2 days, 2 tabs for 2 days, then 1 tab by mouth daily for 2 days 03/05/17   Caccavale, Sophia, PA-C    Allergies    Hydrocodone  Review of Systems   Review of Systems   Constitutional: Negative.   HENT: Negative.    Eyes: Negative.   Respiratory: Negative.    Cardiovascular: Negative.   Gastrointestinal: Negative.   Genitourinary:  Positive for pelvic pain.  Musculoskeletal: Negative.   Neurological: Negative.   Hematological: Negative.    Physical Exam Updated Vital Signs BP (!) 148/99 (BP Location: Right Arm)   Pulse 90   Temp 98 F (36.7 C) (Oral)   Resp 18   Ht 5\' 4"  (1.626 m)   Wt 73.5 kg   SpO2 99%   BMI 27.81 kg/m   Physical Exam Vitals and nursing note reviewed.  Constitutional:      General: She is not in acute distress.    Appearance: She is well-developed. She is not diaphoretic.  HENT:     Head: Normocephalic and atraumatic.     Right Ear: External ear normal.     Left Ear: External ear normal.     Nose: Nose normal.     Mouth/Throat:     Mouth: Mucous membranes are moist.  Eyes:     General: No scleral icterus.    Conjunctiva/sclera: Conjunctivae normal.  Cardiovascular:     Rate and Rhythm: Normal rate and regular rhythm.     Heart sounds: Normal heart sounds. No murmur heard.   No friction rub. No gallop.  Pulmonary:     Effort: Pulmonary effort is normal. No respiratory distress.     Breath sounds: Normal breath sounds.  Abdominal:     General: Bowel sounds are normal. There is no distension.     Palpations: Abdomen is soft. There is no mass.     Tenderness: There is abdominal tenderness in the right lower quadrant and left lower quadrant. There is guarding.  Musculoskeletal:     Cervical back: Normal range of motion.  Skin:    General: Skin is warm and dry.  Neurological:     Mental Status: She is alert and oriented to person, place, and time.  Psychiatric:        Behavior: Behavior normal.    ED Results / Procedures / Treatments   Labs (all labs ordered are listed, but only abnormal results are displayed) Labs Reviewed  URINALYSIS, ROUTINE W REFLEX MICROSCOPIC - Abnormal; Notable for the following  components:      Result Value   Hgb urine dipstick TRACE (*)    All other components within normal limits  URINALYSIS, MICROSCOPIC (REFLEX) - Abnormal; Notable for the following components:   Bacteria, UA RARE (*)    All other components within normal limits  CBC WITH DIFFERENTIAL/PLATELET  COMPREHENSIVE METABOLIC PANEL  LIPASE, BLOOD    EKG None  Radiology No results  found.  Procedures Procedures   Medications Ordered in ED Medications - No data to display  ED Course  I have reviewed the triage vital signs and the nursing notes.  Pertinent labs & imaging results that were available during my care of the patient were reviewed by me and considered in my medical decision making (see chart for details).  Clinical Course as of 12/16/20 0930  Thu Dec 13, 2020  1217 CT ABDOMEN PELVIS W CONTRAST Patient CT scan does show a right adnexal cyst.  No other obvious findings on CT scan.  We will progress to a pelvic ultrasound to rule out torsion. [AH]    Clinical Course User Index [AH] Arthor Captain, PA-C   MDM Rules/Calculators/A&P                           Patient here with c/o pelvic pain s/p hysterectomy. Patient labs included cbc, cmp, UA, Lipase, wnl.  CT imaging shows R sided cyst- unchanged from previous. Pelvic US shows no abnormality of R ovary, L ovary unable to be visualized.- I have low suspicion for torsion- however it is in the differential.  Given her sterility- this is non-emergent. Patient is comfortable without sig pain. Will have her f/u with GYN. She does not appear to have ovarian mass, infection or other emergent cause of pain. Discussed return precautions. Final Clinical Impression(s) / ED Diagnoses Final diagnoses:  None    Rx / DC Orders ED Discharge Orders     None        Arthor Captain, PA-C 12/16/20 2671    Terald Sleeper, MD 12/16/20 [phone]

## 2020-12-24 ENCOUNTER — Other Ambulatory Visit (HOSPITAL_BASED_OUTPATIENT_CLINIC_OR_DEPARTMENT_OTHER): Payer: Self-pay

## 2022-12-16 IMAGING — CT CT ABD-PELV W/ CM
2 of 5 series · 16 of 46 positions shown, 18 images · IV contrast (Omnipaque)
Comparison: Pelvic ultrasound 09/13/2012, CT abdomen/pelvis
01/08/2016

CLINICAL DATA: Pelvic pain

EXAM:
CT ABDOMEN AND PELVIS WITH CONTRAST
TECHNIQUE: Multidetector CT imaging of the abdomen and pelvis was performed
using the standard protocol following bolus administration of
intravenous contrast.
CONTRAST:  100mL OMNIPAQUE IOHEXOL 300 MG/ML  SOLN

[Series 2: axial st · axial · 0.86mm/px · z∈[-448,-68]mm · 13 of 86 slices shown, 15 images]
[im 5/86  soft-tissue]
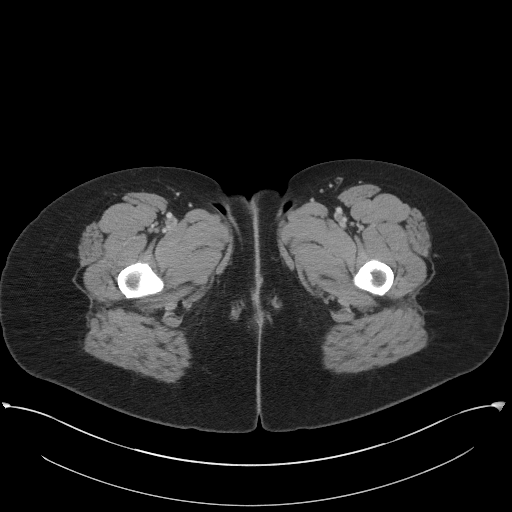
[im 5/86  bone]
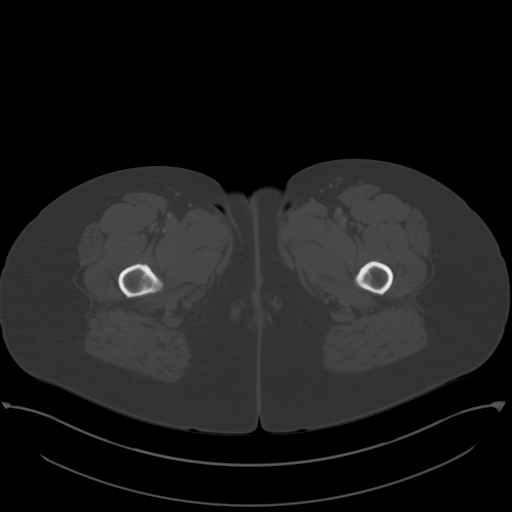
[im 14/86  soft-tissue]
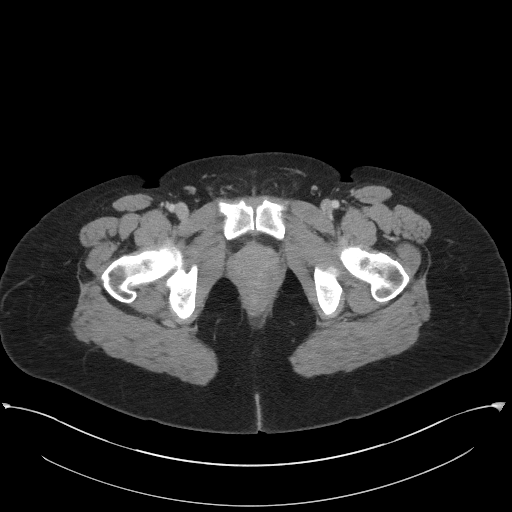
[im 18/86  soft-tissue]
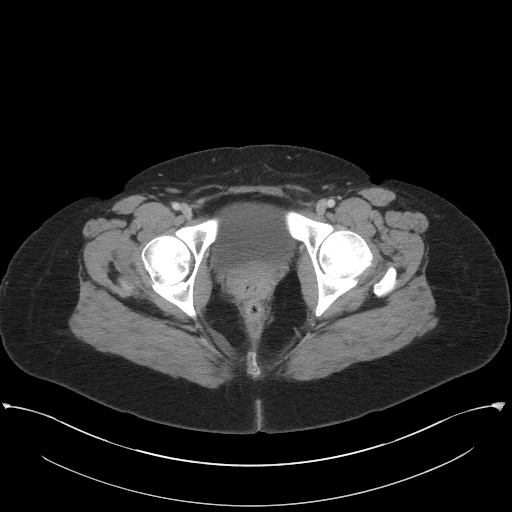
[im 23/86  soft-tissue]
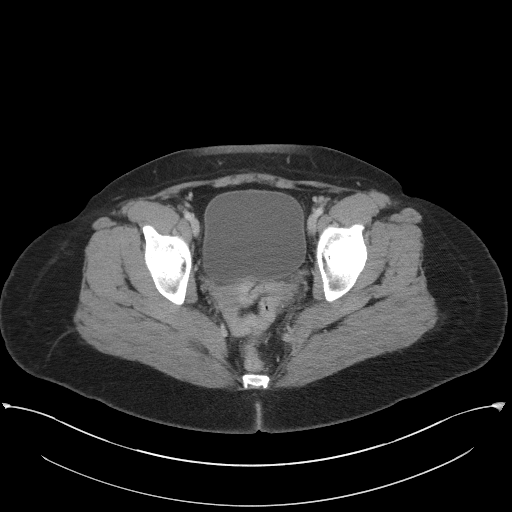
[im 32/86  soft-tissue]
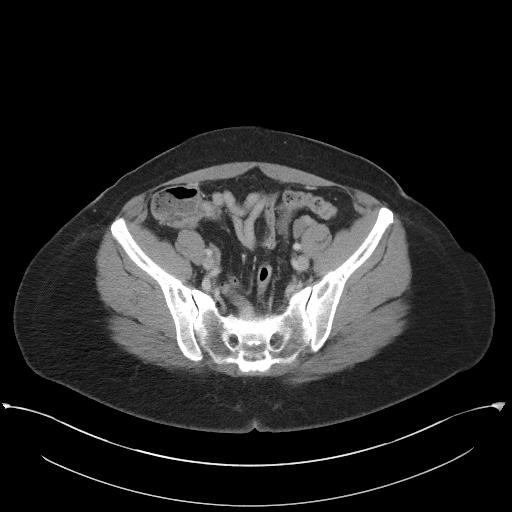
[im 36/86  soft-tissue]
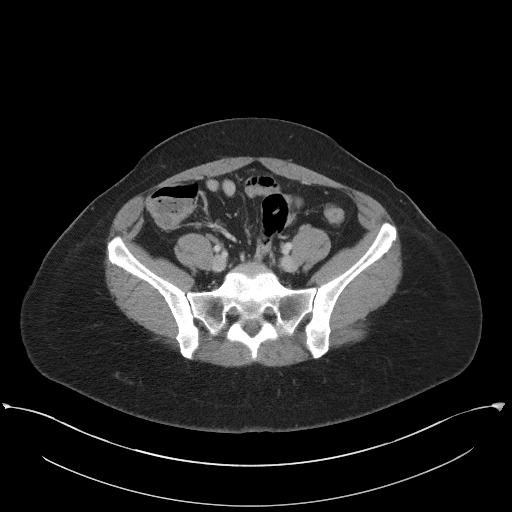
[im 45/86  soft-tissue]
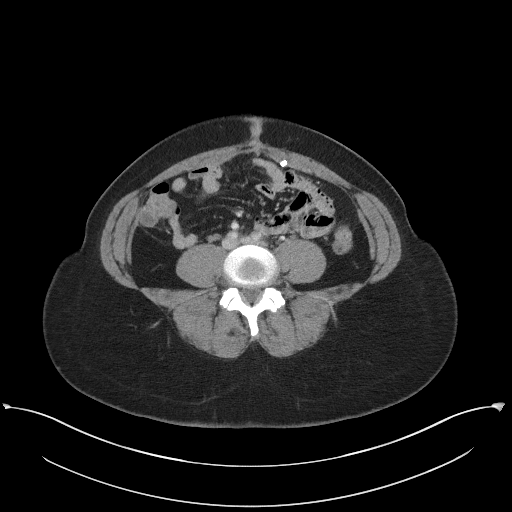
[im 50/86  soft-tissue]
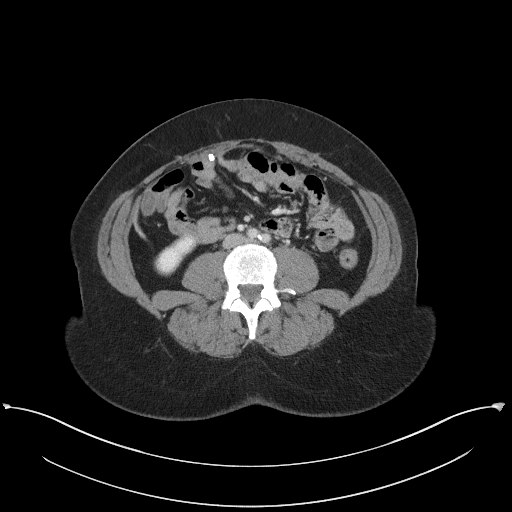
[im 54/86  soft-tissue]
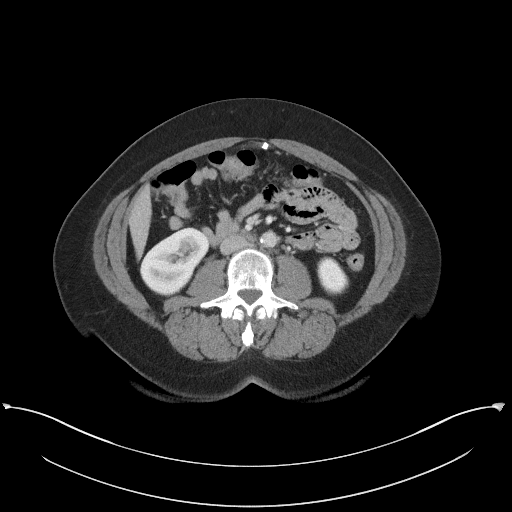
[im 54/86  bone]
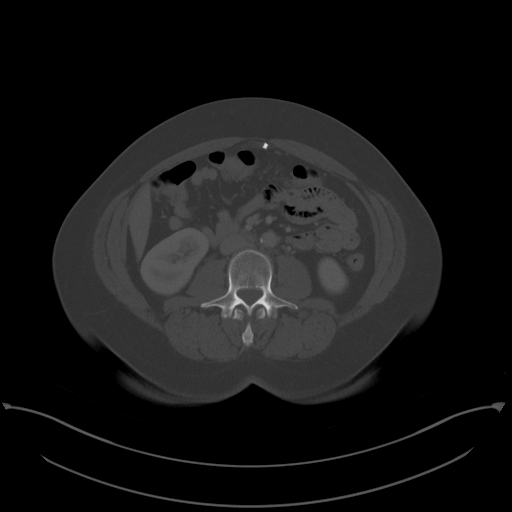
[im 63/86  soft-tissue]
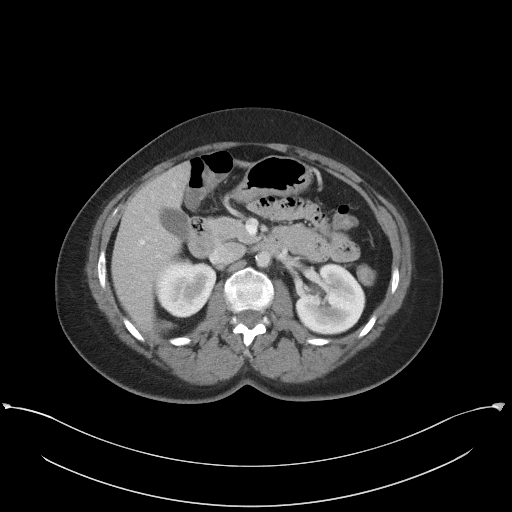
[im 68/86  soft-tissue]
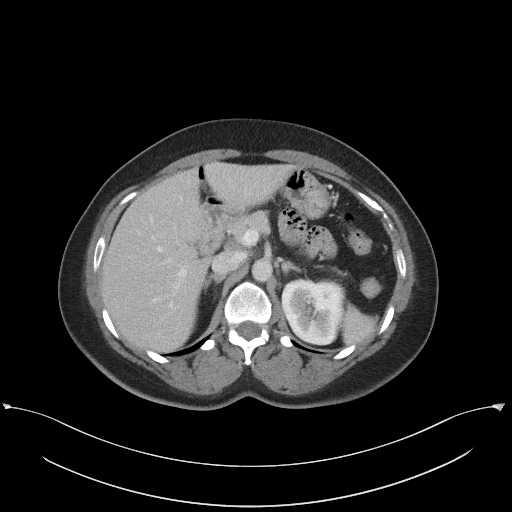
[im 72/86  soft-tissue]
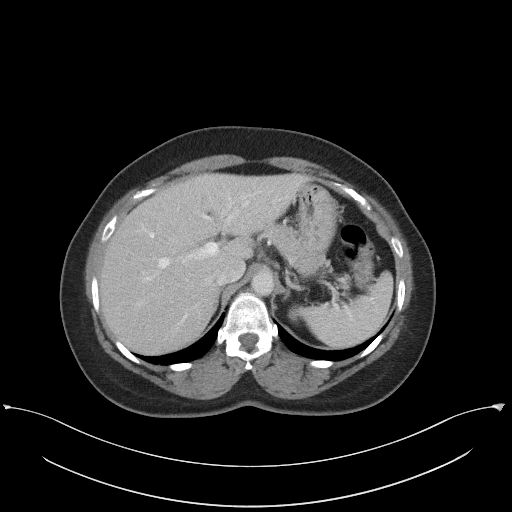
[im 81/86  soft-tissue]
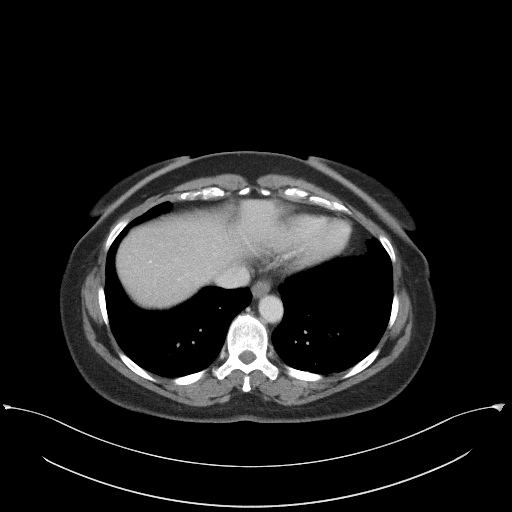

[Series 5: coronal st · coronal · 0.77mm/px · 3 of 94 slices shown]
[im 32/94  soft-tissue]
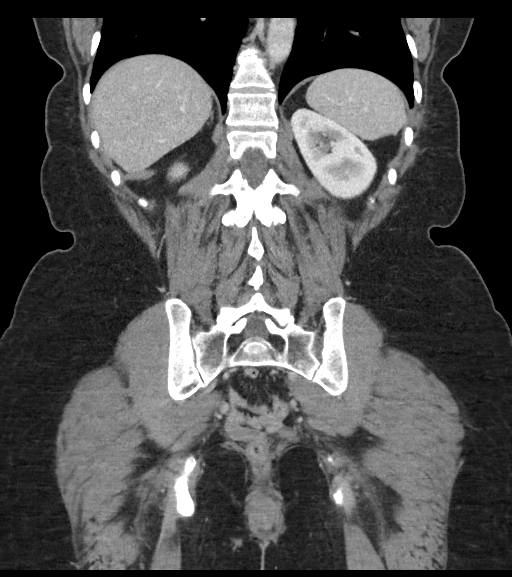
[im 42/94  soft-tissue]
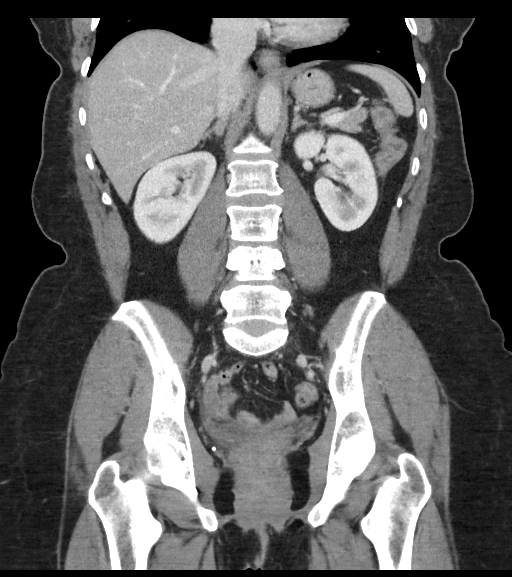
[im 52/94  soft-tissue]
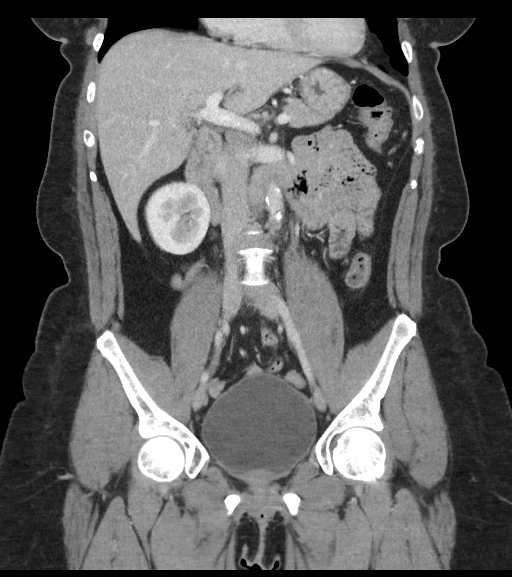

[16 of 46 positions shown; findings below may reference images not displayed]

FINDINGS: Lower chest: There is dependent subsegmental atelectasis in the lung
bases. The imaged heart is unremarkable.

Hepatobiliary: The liver and gallbladder are unremarkable. There is
no biliary ductal dilatation.

Pancreas: Normal.

Spleen: Normal.

Adrenals/Urinary Tract: The adrenals are unremarkable.

The kidneys are unremarkable, with no focal lesions, calculi,
hydronephrosis, or hydroureter. The bladder is unremarkable.

Stomach/Bowel: The stomach is unremarkable there is no evidence of
bowel obstruction. There is no abnormal bowel wall thickening or
inflammatory change. The appendix is normal.

Vascular/Lymphatic: There is scattered calcified atherosclerotic
plaque in the nonaneurysmal abdominal aorta. The main portal and
splenic veins are patent. There is no abdominal or pelvic
lymphadenopathy.

Reproductive: The patient is status post hysterectomy. There is a
1.6 cm right adnexal cyst which appears to have been present in
2569. There is no left adnexal mass.

Other: There is no ascites or free air. Surgical clips along the
ventral abdominal wall likely reflect prior hernia repair.

Musculoskeletal: There is sclerosis along the iliac sides of the SI
joints bilaterally which can be seen with osteitis condensans iliac.
There is no acute osseous abnormality.
IMPRESSION: 1. No acute findings in the abdomen or pelvis.
2. Status post hysterectomy. 1.6 cm right adnexal cyst was likely
present in 2569. Given the patient's symptoms and lab values, pelvic
ultrasound may be considered for further evaluation,.

## 2023-05-08 ENCOUNTER — Other Ambulatory Visit (HOSPITAL_COMMUNITY): Payer: Self-pay
